# Patient Record
Sex: Female | Born: 1955 | Race: White | Hispanic: No | Marital: Married | Smoking: Former smoker
Health system: Southern US, Community
[De-identification: ages and names within clinical notes are randomized; demographics above are authoritative.]

## PROBLEM LIST (undated history)

## (undated) DIAGNOSIS — M1990 Unspecified osteoarthritis, unspecified site: Secondary | ICD-10-CM

## (undated) DIAGNOSIS — Z820 Family history of epilepsy and other diseases of the nervous system: Secondary | ICD-10-CM

## (undated) DIAGNOSIS — I499 Cardiac arrhythmia, unspecified: Secondary | ICD-10-CM

## (undated) DIAGNOSIS — F32A Depression, unspecified: Secondary | ICD-10-CM

## (undated) DIAGNOSIS — R51 Headache: Secondary | ICD-10-CM

## (undated) DIAGNOSIS — K219 Gastro-esophageal reflux disease without esophagitis: Secondary | ICD-10-CM

## (undated) DIAGNOSIS — K449 Diaphragmatic hernia without obstruction or gangrene: Secondary | ICD-10-CM

## (undated) DIAGNOSIS — K8020 Calculus of gallbladder without cholecystitis without obstruction: Secondary | ICD-10-CM

## (undated) DIAGNOSIS — G5762 Lesion of plantar nerve, left lower limb: Secondary | ICD-10-CM

## (undated) DIAGNOSIS — R519 Headache, unspecified: Secondary | ICD-10-CM

## (undated) DIAGNOSIS — R112 Nausea with vomiting, unspecified: Secondary | ICD-10-CM

## (undated) DIAGNOSIS — F329 Major depressive disorder, single episode, unspecified: Secondary | ICD-10-CM

## (undated) DIAGNOSIS — Z9889 Other specified postprocedural states: Secondary | ICD-10-CM

## (undated) DIAGNOSIS — M779 Enthesopathy, unspecified: Secondary | ICD-10-CM

## (undated) DIAGNOSIS — N200 Calculus of kidney: Secondary | ICD-10-CM

## (undated) HISTORY — DX: Family history of epilepsy and other diseases of the nervous system: Z82.0

## (undated) HISTORY — DX: Calculus of kidney: N20.0

## (undated) HISTORY — PX: COLONOSCOPY: SHX174

## (undated) HISTORY — PX: NASAL SINUS SURGERY: SHX719

## (undated) HISTORY — PX: TUBAL LIGATION: SHX77

## (undated) HISTORY — DX: Diaphragmatic hernia without obstruction or gangrene: K44.9

## (undated) HISTORY — DX: Gastro-esophageal reflux disease without esophagitis: K21.9

## (undated) HISTORY — DX: Calculus of gallbladder without cholecystitis without obstruction: K80.20

## (undated) HISTORY — PX: MOUTH SURGERY: SHX715

---

## 1997-12-05 ENCOUNTER — Other Ambulatory Visit: Admission: RE | Admit: 1997-12-05 | Discharge: 1997-12-05 | Payer: Self-pay | Admitting: Obstetrics & Gynecology

## 1998-03-01 ENCOUNTER — Other Ambulatory Visit: Admission: RE | Admit: 1998-03-01 | Discharge: 1998-03-01 | Payer: Self-pay | Admitting: Obstetrics & Gynecology

## 1998-04-17 ENCOUNTER — Ambulatory Visit (HOSPITAL_COMMUNITY): Admission: RE | Admit: 1998-04-17 | Discharge: 1998-04-17 | Payer: Self-pay | Admitting: General Surgery

## 1998-04-17 ENCOUNTER — Encounter: Payer: Self-pay | Admitting: General Surgery

## 1998-04-19 ENCOUNTER — Other Ambulatory Visit: Admission: RE | Admit: 1998-04-19 | Discharge: 1998-04-19 | Payer: Self-pay | Admitting: Obstetrics & Gynecology

## 1998-08-07 ENCOUNTER — Other Ambulatory Visit: Admission: RE | Admit: 1998-08-07 | Discharge: 1998-08-07 | Payer: Self-pay | Admitting: Obstetrics & Gynecology

## 1999-03-06 ENCOUNTER — Other Ambulatory Visit: Admission: RE | Admit: 1999-03-06 | Discharge: 1999-03-06 | Payer: Self-pay | Admitting: Obstetrics & Gynecology

## 1999-06-14 ENCOUNTER — Other Ambulatory Visit: Admission: RE | Admit: 1999-06-14 | Discharge: 1999-06-14 | Payer: Self-pay | Admitting: Obstetrics & Gynecology

## 1999-06-14 ENCOUNTER — Encounter (INDEPENDENT_AMBULATORY_CARE_PROVIDER_SITE_OTHER): Payer: Self-pay | Admitting: Specialist

## 1999-12-18 ENCOUNTER — Other Ambulatory Visit: Admission: RE | Admit: 1999-12-18 | Discharge: 1999-12-18 | Payer: Self-pay | Admitting: Obstetrics & Gynecology

## 2000-10-22 ENCOUNTER — Other Ambulatory Visit: Admission: RE | Admit: 2000-10-22 | Discharge: 2000-10-22 | Payer: Self-pay | Admitting: Obstetrics & Gynecology

## 2001-07-08 ENCOUNTER — Other Ambulatory Visit: Admission: RE | Admit: 2001-07-08 | Discharge: 2001-07-08 | Payer: Self-pay | Admitting: Obstetrics & Gynecology

## 2002-03-08 ENCOUNTER — Encounter: Payer: Self-pay | Admitting: Otolaryngology

## 2002-03-08 ENCOUNTER — Encounter: Admission: RE | Admit: 2002-03-08 | Discharge: 2002-03-08 | Payer: Self-pay | Admitting: Otolaryngology

## 2002-08-04 ENCOUNTER — Other Ambulatory Visit: Admission: RE | Admit: 2002-08-04 | Discharge: 2002-08-04 | Payer: Self-pay | Admitting: Obstetrics & Gynecology

## 2003-09-01 ENCOUNTER — Other Ambulatory Visit: Admission: RE | Admit: 2003-09-01 | Discharge: 2003-09-01 | Payer: Self-pay | Admitting: Obstetrics & Gynecology

## 2003-11-02 ENCOUNTER — Encounter: Admission: RE | Admit: 2003-11-02 | Discharge: 2003-11-02 | Payer: Self-pay | Admitting: Obstetrics & Gynecology

## 2004-10-18 ENCOUNTER — Other Ambulatory Visit: Admission: RE | Admit: 2004-10-18 | Discharge: 2004-10-18 | Payer: Self-pay | Admitting: Obstetrics & Gynecology

## 2009-04-24 ENCOUNTER — Emergency Department (HOSPITAL_COMMUNITY): Admission: EM | Admit: 2009-04-24 | Discharge: 2009-04-25 | Payer: Self-pay | Admitting: Emergency Medicine

## 2010-04-11 LAB — BASIC METABOLIC PANEL
CO2: 27 mEq/L (ref 19–32)
Calcium: 9.7 mg/dL (ref 8.4–10.5)
Creatinine, Ser: 1.02 mg/dL (ref 0.4–1.2)
GFR calc Af Amer: >60 mL/min (ref >60)
GFR calc non Af Amer: 56 mL/min — ABNORMAL LOW (ref >60)
Glucose, Bld: 136 mg/dL — ABNORMAL HIGH (ref 70–99)
Sodium: 140 mEq/L (ref 135–145)

## 2010-04-11 LAB — CBC
HCT: 39.1 % (ref 36.0–46.0)
MCHC: 33.7 g/dL (ref 30.0–36.0)
MCV: 88.5 fL (ref 78.0–100.0)
Platelets: 330 10*3/uL (ref 150–400)

## 2010-04-11 LAB — DIFFERENTIAL
Basophils Relative: 0 % (ref 0–1)
Eosinophils Absolute: 0.1 10*3/uL (ref 0.0–0.7)
Eosinophils Relative: 1 % (ref 0–5)
Lymphs Abs: 1.4 10*3/uL (ref 0.7–4.0)
Neutrophils Relative %: 86 % — ABNORMAL HIGH (ref 43–77)

## 2010-04-11 LAB — URINALYSIS, ROUTINE W REFLEX MICROSCOPIC
Bilirubin Urine: NEGATIVE
Glucose, UA: NEGATIVE mg/dL
Hgb urine dipstick: NEGATIVE
Ketones, ur: NEGATIVE mg/dL
Nitrite: NEGATIVE
Protein, ur: NEGATIVE mg/dL
Specific Gravity, Urine: 1.015 (ref 1.005–1.030)
Urobilinogen, UA: 0.2 mg/dL (ref 0.0–1.0)
pH: 7.5 (ref 5.0–8.0)

## 2010-04-11 LAB — URINE MICROSCOPIC-ADD ON

## 2010-10-29 ENCOUNTER — Ambulatory Visit
Admission: RE | Admit: 2010-10-29 | Discharge: 2010-10-29 | Disposition: A | Discharge status: Home / Self Care | Payer: BC Managed Care – PPO | Source: Ambulatory Visit | Attending: Internal Medicine | Admitting: Internal Medicine

## 2010-10-29 ENCOUNTER — Other Ambulatory Visit: Payer: Self-pay | Admitting: Internal Medicine

## 2010-10-29 MED ORDER — IOHEXOL 300 MG/ML  SOLN
100.0000 mL | Freq: Once | INTRAMUSCULAR | Status: AC | PRN
  Administered 2010-10-29: 100 mL via INTRAVENOUS

## 2010-11-15 ENCOUNTER — Ambulatory Visit (HOSPITAL_COMMUNITY)
Admission: RE | Admit: 2010-11-15 | Discharge: 2010-11-15 | Disposition: A | Discharge status: Home / Self Care | Payer: BC Managed Care – PPO | Source: Ambulatory Visit | Attending: Urology | Admitting: Urology

## 2010-11-15 DIAGNOSIS — Z79899 Other long term (current) drug therapy: Secondary | ICD-10-CM | POA: Insufficient documentation

## 2010-11-15 DIAGNOSIS — E7800 Pure hypercholesterolemia, unspecified: Secondary | ICD-10-CM | POA: Insufficient documentation

## 2010-11-15 DIAGNOSIS — N200 Calculus of kidney: Secondary | ICD-10-CM | POA: Insufficient documentation

## 2010-11-15 DIAGNOSIS — N201 Calculus of ureter: Secondary | ICD-10-CM | POA: Insufficient documentation

## 2011-01-22 HISTORY — PX: FOOT SURGERY: SHX648

## 2011-01-22 HISTORY — PX: LITHOTRIPSY: SUR834

## 2011-03-13 ENCOUNTER — Other Ambulatory Visit: Payer: Self-pay | Admitting: Obstetrics & Gynecology

## 2011-03-13 DIAGNOSIS — R928 Other abnormal and inconclusive findings on diagnostic imaging of breast: Secondary | ICD-10-CM

## 2011-03-21 ENCOUNTER — Ambulatory Visit
Admission: RE | Admit: 2011-03-21 | Discharge: 2011-03-21 | Disposition: A | Discharge status: Home / Self Care | Payer: BC Managed Care – PPO | Source: Ambulatory Visit | Attending: Obstetrics & Gynecology | Admitting: Obstetrics & Gynecology

## 2011-03-21 DIAGNOSIS — R928 Other abnormal and inconclusive findings on diagnostic imaging of breast: Secondary | ICD-10-CM

## 2012-04-16 ENCOUNTER — Ambulatory Visit (INDEPENDENT_AMBULATORY_CARE_PROVIDER_SITE_OTHER): Payer: BC Managed Care – PPO | Admitting: General Surgery

## 2012-04-16 ENCOUNTER — Encounter (INDEPENDENT_AMBULATORY_CARE_PROVIDER_SITE_OTHER): Payer: Self-pay | Admitting: General Surgery

## 2012-04-16 ENCOUNTER — Encounter (INDEPENDENT_AMBULATORY_CARE_PROVIDER_SITE_OTHER): Payer: Self-pay

## 2012-04-16 VITALS — BP 120/80 | HR 92 | Resp 14 | Ht 67.0 in | Wt 178.0 lb

## 2012-04-16 DIAGNOSIS — K8020 Calculus of gallbladder without cholecystitis without obstruction: Secondary | ICD-10-CM

## 2012-04-16 NOTE — Progress Notes (Signed)
Patient ID: Carrie Holland, female   DOB: September 12, 1955, 57 y.o.   MRN: [id number]  No chief complaint on file.   HPI Carrie Holland is a 58 y.o. female.   HPI  She is referred by Dr. Ishmael Holter because of symptomatic gallstones. She's been having problems with these for about 4 years. Recently her episodes have become more frequent. They consist of right upper quadrant pain with some nausea and vomiting. She's had known gallstones for 3-4 years. No jaundice. There is a significant family history of gallbladder disease.  Past Medical History  Diagnosis Date  . Hiatal hernia   . FHx: migraine headaches   . Gallstones   . Kidney stones   . GERD (gastroesophageal reflux disease)     Past Surgical History  Procedure Laterality Date  . Tubal ligation    . Cesarean section  1982 & 1985  . Nasal sinus surgery    . Foot surgery  2013  . Lithotripsy  2013    Family History  Problem Relation Age of Onset  . Heart disease Father   . Headache Father   . Lung disease Father   . Hepatitis Brother     Social History History  Substance Use Topics  . Smoking status: Former Games developer  . Smokeless tobacco: Not on file  . Alcohol Use: Yes     Comment: socially    Allergies  Allergen Reactions  . Ceclor (Cefaclor)   . Penicillins     Current Outpatient Prescriptions  Medication Sig Dispense Refill  . atorvastatin (LIPITOR) 40 MG tablet Take 40 mg by mouth daily.      Marland Kitchen ALPRAZolam (XANAX) 0.5 MG tablet Take 0.5 mg by mouth at bedtime as needed for sleep.      . Venlafaxine HCl (EFFEXOR PO) Take 220 mg by mouth daily.        No current facility-administered medications for this visit.    Review of Systems Review of Systems  Respiratory: Negative.   Gastrointestinal: Positive for nausea, vomiting and abdominal pain.  Genitourinary: Negative.   Allergic/Immunologic: Negative for immunocompromised state.  Hematological: Negative.     Blood pressure 120/80, pulse 92, resp.  rate 14, height 5\' 7"  (1.702 m), weight 178 lb (80.74 kg).  Physical Exam Physical Exam  Constitutional: She appears well-developed and well-nourished. No distress.  HENT:  Head: Normocephalic and atraumatic.  Eyes: EOM are normal. No scleral icterus.  Cardiovascular: Normal rate and regular rhythm.   Pulmonary/Chest: Effort normal and breath sounds normal.  Abdominal: Soft. She exhibits no distension and no mass. There is no tenderness.  Small subumbilical scar. Lower transverse scar.  Musculoskeletal: She exhibits no edema.  Neurological: She is alert.  Skin: Skin is warm and dry.    Data Reviewed Old CT scan demonstrating cholelithiasis.  Assessment    Symptomatic cholelithiasis.     Plan    I recommended laparoscopic cholecystectomy.  I have explained the procedure, risks, and aftercare of cholecystectomy.  Risks include but are not limited to bleeding, infection, wound problems, anesthesia, diarrhea, bile leak, injury to common bile duct/liver/intestine.  She seems to understand and agrees to proceed.         Mahir Prabhakar J 04/16/2012, 2:06 PM

## 2012-04-16 NOTE — Patient Instructions (Signed)
[facility] ______[facility] LAPAROSCOPIC SURGERY: POST OP INSTRUCTIONS Always review your discharge instruction sheet given to you by the facility where your surgery was performed. IF YOU HAVE DISABILITY OR FAMILY LEAVE FORMS, YOU MUST BRING THEM TO THE OFFICE FOR PROCESSING.   DO NOT GIVE THEM TO YOUR DOCTOR.  1. A prescription for pain medication may be given to you upon discharge.  Take your pain medication as prescribed, if needed.  If narcotic pain medicine is not needed, then you may take acetaminophen (Tylenol) or ibuprofen (Advil) as needed. 2. Take your usually prescribed medications unless otherwise directed. 3. If you need a refill on your pain medication, please contact your pharmacy.  They will contact our office to request authorization. Prescriptions will not be filled after 5pm or on week-ends. 4. You should follow a light diet the first few days after arrival home, such as soup and crackers, etc.  Be sure to include lots of fluids daily. 5. Most patients will experience some swelling and bruising in the area of the incisions.  Ice packs will help.  Swelling and bruising can take several days to resolve.  6. It is common to experience some constipation if taking pain medication after surgery.  Increasing fluid intake and taking a stool softener (such as Colace) will usually help or prevent this problem from occurring.  A mild laxative (Milk of Magnesia or Miralax) should be taken according to package instructions if there are no bowel movements after 48 hours. 7. Unless discharge instructions indicate otherwise, you may remove your bandages 24-48 hours after surgery, and you may shower at that time.  You may have steri-strips (small skin tapes) in place directly over the incision.  These strips should be left on the skin for 7-10 days.  If your surgeon used skin glue on the incision, you may shower in 24 hours.  The glue will flake off over the next 2-3 weeks.  Any sutures or  staples will be removed at the office during your follow-up visit. 8. ACTIVITIES:  You may resume regular (light) daily activities beginning the next day-such as daily self-care, walking, climbing stairs-gradually increasing activities as tolerated.  You may have sexual intercourse when it is comfortable.  Refrain from any heavy lifting or straining for two weeks. a. You may drive when you are no longer taking prescription pain medication, you can comfortably wear a seatbelt, and you can safely maneuver your car and apply brakes. b. RETURN TO WORK:  __________________________________________________________ 9. You should see your doctor in the office for a follow-up appointment approximately 2-3 weeks after your surgery.  Make sure that you call for this appointment within a day or two after you arrive home to insure a convenient appointment time. 10. OTHER INSTRUCTIONS: __________________________________________________________________________________________________________________________ __________________________________________________________________________________________________________________________ WHEN TO CALL YOUR DOCTOR: 1. Fever over 101.5 2. Inability to urinate 3. Continued bleeding from incision. 4. Increased pain, redness, or drainage from the incision. 5. Increasing abdominal pain  The clinic staff is available to answer your questions during regular business hours.  Please don't hesitate to call and ask to speak to one of the nurses for clinical concerns.  If you have a medical emergency, go to the nearest emergency room or call 911.  A surgeon from [facility] is always on call at the hospital. [address], [place], [place]  [address] ? [address] [address], [place], [place]   [address] [phone] ? [phone] ? FAX [phone] Web site: [email or web address]

## 2012-04-17 ENCOUNTER — Encounter (HOSPITAL_COMMUNITY): Payer: Self-pay | Admitting: Pharmacy Technician

## 2012-04-20 NOTE — Patient Instructions (Signed)
Carrie Holland  04/20/2012   Your procedure is scheduled on:  04/24/12   Report to [facility] at   0900  AM.  Call this number if you have problems the morning of surgery: [phone]   Remember:   Do not eat food or drink liquids after midnight.   Take these medicines the morning of surgery with A SIP OF WATER:    Do not wear jewelry, make-up or nail polish.  Do not wear lotions, powders, or perfumes.   Do not shave 48 hours prior to surgery.   Do not bring valuables to the hospital.  Contacts, dentures or bridgework may not be worn into surgery.      Patients discharged the day of surgery will not be allowed to drive  home.  Name and phone number of your driver:    SEE [facility]G INSTRUCTION SHEET    Please read over the following fact sheets that you were given: MRSA Information, coughing and deep breathing exercises, leg exercises               Failure to comply with these instructions may result in cancellation of your surgery.                Patient Signature ____________________________              Nurse Signature _____________________________

## 2012-04-21 ENCOUNTER — Encounter (HOSPITAL_COMMUNITY): Payer: Self-pay

## 2012-04-21 ENCOUNTER — Encounter (HOSPITAL_COMMUNITY)
Admission: RE | Admit: 2012-04-21 | Discharge: 2012-04-21 | Disposition: A | Discharge status: Home / Self Care | Payer: BC Managed Care – PPO | Source: Ambulatory Visit | Attending: General Surgery | Admitting: General Surgery

## 2012-04-21 HISTORY — DX: Unspecified osteoarthritis, unspecified site: M19.90

## 2012-04-21 HISTORY — DX: Depression, unspecified: F32.A

## 2012-04-21 HISTORY — DX: Enthesopathy, unspecified: M77.9

## 2012-04-21 HISTORY — DX: Major depressive disorder, single episode, unspecified: F32.9

## 2012-04-21 HISTORY — DX: Other specified postprocedural states: Z98.890

## 2012-04-21 HISTORY — DX: Cardiac arrhythmia, unspecified: I49.9

## 2012-04-21 HISTORY — DX: Other specified postprocedural states: R11.2

## 2012-04-21 LAB — COMPREHENSIVE METABOLIC PANEL
ALT: 29 U/L (ref 0–35)
AST: 29 U/L (ref 0–37)
Albumin: 4.3 g/dL (ref 3.5–5.2)
Alkaline Phosphatase: 91 U/L (ref 39–117)
Chloride: 102 mEq/L (ref 96–112)
Potassium: 4.1 mEq/L (ref 3.5–5.1)
Sodium: 139 mEq/L (ref 135–145)
Total Bilirubin: 0.5 mg/dL (ref 0.3–1.2)
Total Protein: 7.5 g/dL (ref 6.0–8.3)

## 2012-04-21 LAB — CBC WITH DIFFERENTIAL/PLATELET
Basophils Relative: 0 % (ref 0–1)
Eosinophils Absolute: 0.1 10*3/uL (ref 0.0–0.7)
Hemoglobin: 14.8 g/dL (ref 12.0–15.0)
MCH: 29.1 pg (ref 26.0–34.0)
MCHC: 33.3 g/dL (ref 30.0–36.0)
Neutro Abs: 5.4 10*3/uL (ref 1.7–7.7)
Neutrophils Relative %: 60 % (ref 43–77)
Platelets: 336 10*3/uL (ref 150–400)
RBC: 5.08 MIL/uL (ref 3.87–5.11)

## 2012-04-21 LAB — PROTIME-INR: Prothrombin Time: 13 seconds (ref 11.6–15.2)

## 2012-04-24 ENCOUNTER — Encounter (HOSPITAL_COMMUNITY)
Admission: RE | Disposition: A | Discharge status: Home / Self Care | Payer: Self-pay | Source: Ambulatory Visit | Attending: General Surgery

## 2012-04-24 ENCOUNTER — Encounter (HOSPITAL_COMMUNITY): Payer: Self-pay | Admitting: *Deleted

## 2012-04-24 ENCOUNTER — Ambulatory Visit (HOSPITAL_COMMUNITY)
Admission: RE | Admit: 2012-04-24 | Discharge: 2012-04-24 | Disposition: A | Discharge status: Home / Self Care | Payer: BC Managed Care – PPO | Source: Ambulatory Visit | Attending: General Surgery | Admitting: General Surgery

## 2012-04-24 ENCOUNTER — Ambulatory Visit (HOSPITAL_COMMUNITY): Payer: BC Managed Care – PPO

## 2012-04-24 ENCOUNTER — Encounter (HOSPITAL_COMMUNITY): Payer: Self-pay | Admitting: Anesthesiology

## 2012-04-24 ENCOUNTER — Ambulatory Visit (HOSPITAL_COMMUNITY): Payer: BC Managed Care – PPO | Admitting: Anesthesiology

## 2012-04-24 DIAGNOSIS — K8010 Calculus of gallbladder with chronic cholecystitis without obstruction: Secondary | ICD-10-CM

## 2012-04-24 DIAGNOSIS — K449 Diaphragmatic hernia without obstruction or gangrene: Secondary | ICD-10-CM | POA: Insufficient documentation

## 2012-04-24 DIAGNOSIS — K824 Cholesterolosis of gallbladder: Secondary | ICD-10-CM

## 2012-04-24 DIAGNOSIS — K219 Gastro-esophageal reflux disease without esophagitis: Secondary | ICD-10-CM | POA: Insufficient documentation

## 2012-04-24 DIAGNOSIS — Z01810 Encounter for preprocedural cardiovascular examination: Secondary | ICD-10-CM | POA: Insufficient documentation

## 2012-04-24 DIAGNOSIS — K8020 Calculus of gallbladder without cholecystitis without obstruction: Secondary | ICD-10-CM | POA: Insufficient documentation

## 2012-04-24 DIAGNOSIS — Z79899 Other long term (current) drug therapy: Secondary | ICD-10-CM | POA: Insufficient documentation

## 2012-04-24 HISTORY — PX: CHOLECYSTECTOMY: SHX55

## 2012-04-24 SURGERY — LAPAROSCOPIC CHOLECYSTECTOMY WITH INTRAOPERATIVE CHOLANGIOGRAM
Anesthesia: General | Site: Abdomen | Wound class: Clean Contaminated

## 2012-04-24 MED ORDER — FENTANYL CITRATE 0.05 MG/ML IJ SOLN
INTRAMUSCULAR | Status: DC | PRN
  Administered 2012-04-24: 50 ug via INTRAVENOUS
  Administered 2012-04-24: 100 ug via INTRAVENOUS
  Administered 2012-04-24: 50 ug via INTRAVENOUS

## 2012-04-24 MED ORDER — MIDAZOLAM HCL 5 MG/5ML IJ SOLN
INTRAMUSCULAR | Status: DC | PRN
  Administered 2012-04-24: 2 mg via INTRAVENOUS

## 2012-04-24 MED ORDER — FENTANYL CITRATE 0.05 MG/ML IJ SOLN: 25.0000 ug | INTRAMUSCULAR | Status: DC | PRN

## 2012-04-24 MED ORDER — MEPERIDINE HCL 50 MG/ML IJ SOLN
INTRAMUSCULAR | Status: AC
  Filled 2012-04-24: qty 1

## 2012-04-24 MED ORDER — ACETAMINOPHEN 325 MG PO TABS: 650.0000 mg | ORAL_TABLET | ORAL | Status: DC | PRN

## 2012-04-24 MED ORDER — NEOSTIGMINE METHYLSULFATE 1 MG/ML IJ SOLN
INTRAMUSCULAR | Status: DC | PRN
  Administered 2012-04-24: 4 mg via INTRAVENOUS

## 2012-04-24 MED ORDER — ACETAMINOPHEN 10 MG/ML IV SOLN
INTRAVENOUS | Status: DC | PRN
  Administered 2012-04-24: 1000 mg via INTRAVENOUS

## 2012-04-24 MED ORDER — LIDOCAINE HCL (CARDIAC) 20 MG/ML IV SOLN
INTRAVENOUS | Status: DC | PRN
  Administered 2012-04-24: 50 mg via INTRAVENOUS

## 2012-04-24 MED ORDER — LACTATED RINGERS IV SOLN
INTRAVENOUS | Status: DC | PRN
  Administered 2012-04-24: 1000 mL via INTRAVENOUS

## 2012-04-24 MED ORDER — ROCURONIUM BROMIDE 100 MG/10ML IV SOLN
INTRAVENOUS | Status: DC | PRN
  Administered 2012-04-24: 40 mg via INTRAVENOUS

## 2012-04-24 MED ORDER — IOHEXOL 300 MG/ML  SOLN
INTRAMUSCULAR | Status: AC
  Filled 2012-04-24: qty 1

## 2012-04-24 MED ORDER — PROPOFOL 10 MG/ML IV BOLUS
INTRAVENOUS | Status: DC | PRN
  Administered 2012-04-24: 180 mg via INTRAVENOUS

## 2012-04-24 MED ORDER — METOCLOPRAMIDE HCL 5 MG/ML IJ SOLN
INTRAMUSCULAR | Status: DC | PRN
  Administered 2012-04-24: 10 mg via INTRAVENOUS

## 2012-04-24 MED ORDER — ONDANSETRON HCL 4 MG/2ML IJ SOLN
INTRAMUSCULAR | Status: DC | PRN
  Administered 2012-04-24: 4 mg via INTRAVENOUS

## 2012-04-24 MED ORDER — PHENYLEPHRINE HCL 10 MG/ML IJ SOLN
INTRAMUSCULAR | Status: DC | PRN
  Administered 2012-04-24: 80 ug via INTRAVENOUS

## 2012-04-24 MED ORDER — MEPERIDINE HCL 50 MG/ML IJ SOLN
6.2500 mg | INTRAMUSCULAR | Status: DC | PRN
  Administered 2012-04-24: 12.5 mg via INTRAVENOUS

## 2012-04-24 MED ORDER — PROMETHAZINE HCL 25 MG/ML IJ SOLN: 6.2500 mg | INTRAMUSCULAR | Status: DC | PRN

## 2012-04-24 MED ORDER — DEXTROSE IN LACTATED RINGERS 5 % IV SOLN
INTRAVENOUS | Status: DC
  Administered 2012-04-24: 14:00:00 via INTRAVENOUS

## 2012-04-24 MED ORDER — ACETAMINOPHEN 10 MG/ML IV SOLN
INTRAVENOUS | Status: AC
  Filled 2012-04-24: qty 100

## 2012-04-24 MED ORDER — CIPROFLOXACIN IN D5W 400 MG/200ML IV SOLN
400.0000 mg | INTRAVENOUS | Status: AC
  Administered 2012-04-24: 400 mg via INTRAVENOUS

## 2012-04-24 MED ORDER — CIPROFLOXACIN IN D5W 400 MG/200ML IV SOLN
INTRAVENOUS | Status: AC
  Filled 2012-04-24: qty 200

## 2012-04-24 MED ORDER — SODIUM CHLORIDE 0.9 % IJ SOLN: 3.0000 mL | Freq: Two times a day (BID) | INTRAMUSCULAR | Status: DC

## 2012-04-24 MED ORDER — KETOROLAC TROMETHAMINE 30 MG/ML IJ SOLN: 15.0000 mg | Freq: Once | INTRAMUSCULAR | Status: DC | PRN

## 2012-04-24 MED ORDER — LACTATED RINGERS IV SOLN
INTRAVENOUS | Status: DC
  Administered 2012-04-24: 12:00:00 via INTRAVENOUS
  Administered 2012-04-24: 1000 mL via INTRAVENOUS

## 2012-04-24 MED ORDER — BUPIVACAINE HCL (PF) 0.5 % IJ SOLN
INTRAMUSCULAR | Status: AC
  Filled 2012-04-24: qty 30

## 2012-04-24 MED ORDER — SODIUM CHLORIDE 0.9 % IV SOLN: 250.0000 mL | INTRAVENOUS | Status: DC | PRN

## 2012-04-24 MED ORDER — SODIUM CHLORIDE 0.9 % IJ SOLN: 3.0000 mL | INTRAMUSCULAR | Status: DC | PRN

## 2012-04-24 MED ORDER — BUPIVACAINE HCL 0.5 % IJ SOLN
INTRAMUSCULAR | Status: DC | PRN
  Administered 2012-04-24: 15 mL

## 2012-04-24 MED ORDER — ONDANSETRON HCL 4 MG/2ML IJ SOLN: 4.0000 mg | Freq: Four times a day (QID) | INTRAMUSCULAR | Status: DC | PRN

## 2012-04-24 MED ORDER — ACETAMINOPHEN 650 MG RE SUPP
650.0000 mg | RECTAL | Status: DC | PRN
  Filled 2012-04-24: qty 1

## 2012-04-24 MED ORDER — SCOPOLAMINE 1 MG/3DAYS TD PT72
MEDICATED_PATCH | TRANSDERMAL | Status: AC
  Filled 2012-04-24: qty 1

## 2012-04-24 MED ORDER — MORPHINE SULFATE 10 MG/ML IJ SOLN: 2.0000 mg | INTRAMUSCULAR | Status: DC | PRN

## 2012-04-24 MED ORDER — IOHEXOL 300 MG/ML  SOLN
INTRAMUSCULAR | Status: DC | PRN
  Administered 2012-04-24: 50 mL via INTRAVENOUS

## 2012-04-24 MED ORDER — GLYCOPYRROLATE 0.2 MG/ML IJ SOLN
INTRAMUSCULAR | Status: DC | PRN
  Administered 2012-04-24: 0.6 mg via INTRAVENOUS

## 2012-04-24 MED ORDER — MEPERIDINE HCL 50 MG PO TABS: 50.0000 mg | ORAL_TABLET | ORAL | Status: DC | PRN

## 2012-04-24 MED ORDER — DEXAMETHASONE SODIUM PHOSPHATE 10 MG/ML IJ SOLN
INTRAMUSCULAR | Status: DC | PRN
  Administered 2012-04-24: 10 mg via INTRAVENOUS

## 2012-04-24 SURGICAL SUPPLY — 41 items
APPLIER CLIP 5 13 M/L LIGAMAX5 (MISCELLANEOUS) ×2
APPLIER CLIP ROT 10 11.4 M/L (STAPLE)
BENZOIN TINCTURE PRP APPL 2/3 (GAUZE/BANDAGES/DRESSINGS) ×2 IMPLANT
CANISTER SUCTION 2500CC (MISCELLANEOUS) ×2 IMPLANT
CHLORAPREP W/TINT 26ML (MISCELLANEOUS) ×2 IMPLANT
CLIP APPLIE 5 13 M/L LIGAMAX5 (MISCELLANEOUS) ×1 IMPLANT
CLIP APPLIE ROT 10 11.4 M/L (STAPLE) IMPLANT
CLOTH BEACON ORANGE TIMEOUT ST (SAFETY) ×2 IMPLANT
COVER MAYO STAND STRL (DRAPES) ×2 IMPLANT
COVER SURGICAL LIGHT HANDLE (MISCELLANEOUS) IMPLANT
DECANTER SPIKE VIAL GLASS SM (MISCELLANEOUS) ×2 IMPLANT
DRAPE C-ARM 42X72 X-RAY (DRAPES) ×2 IMPLANT
DRAPE LAPAROSCOPIC ABDOMINAL (DRAPES) ×2 IMPLANT
DRAPE UTILITY XL STRL (DRAPES) ×2 IMPLANT
DRSG TEGADERM 2-3/8X2-3/4 SM (GAUZE/BANDAGES/DRESSINGS) ×8 IMPLANT
ELECT REM PT RETURN 9FT ADLT (ELECTROSURGICAL) ×2
ELECTRODE REM PT RTRN 9FT ADLT (ELECTROSURGICAL) ×1 IMPLANT
ENDOLOOP SUT PDS II  0 18 (SUTURE)
ENDOLOOP SUT PDS II 0 18 (SUTURE) IMPLANT
GLOVE BIOGEL PI IND STRL 7.0 (GLOVE) ×1 IMPLANT
GLOVE BIOGEL PI INDICATOR 7.0 (GLOVE) ×1
GLOVE ECLIPSE 8.0 STRL XLNG CF (GLOVE) ×2 IMPLANT
GLOVE INDICATOR 8.0 STRL GRN (GLOVE) ×4 IMPLANT
GOWN STRL NON-REIN LRG LVL3 (GOWN DISPOSABLE) ×2 IMPLANT
GOWN STRL REIN XL XLG (GOWN DISPOSABLE) ×8 IMPLANT
HEMOSTAT SURGICEL 4X8 (HEMOSTASIS) IMPLANT
IV CATH 14GX2 1/4 (CATHETERS) IMPLANT
KIT BASIN OR (CUSTOM PROCEDURE TRAY) ×2 IMPLANT
NS IRRIG 1000ML POUR BTL (IV SOLUTION) ×2 IMPLANT
POUCH SPECIMEN RETRIEVAL 10MM (ENDOMECHANICALS) ×2 IMPLANT
SET CHOLANGIOGRAPH MIX (MISCELLANEOUS) ×2 IMPLANT
SET IRRIG TUBING LAPAROSCOPIC (IRRIGATION / IRRIGATOR) ×2 IMPLANT
SOLUTION ANTI FOG 6CC (MISCELLANEOUS) ×2 IMPLANT
STRIP CLOSURE SKIN 1/2X4 (GAUZE/BANDAGES/DRESSINGS) ×2 IMPLANT
SUT MNCRL AB 4-0 PS2 18 (SUTURE) ×2 IMPLANT
TOWEL OR 17X26 10 PK STRL BLUE (TOWEL DISPOSABLE) ×2 IMPLANT
TRAY LAP CHOLE (CUSTOM PROCEDURE TRAY) ×2 IMPLANT
TROCAR BLADELESS OPT 5 75 (ENDOMECHANICALS) ×6 IMPLANT
TROCAR XCEL BLUNT TIP 100MML (ENDOMECHANICALS) ×2 IMPLANT
TROCAR XCEL NON-BLD 11X100MML (ENDOMECHANICALS) ×2 IMPLANT
TUBING INSUFFLATION 10FT LAP (TUBING) ×2 IMPLANT

## 2012-04-24 NOTE — Progress Notes (Signed)
Dr. Okey Dupre made aware of patient's heart rates being [phone] blood pressures stable.

## 2012-04-24 NOTE — Op Note (Signed)
Preoperative diagnosis:  Symptomatic cholelithiasis  Postoperative diagnosis:  same  Procedure: Laparoscopic cholecystectomy with cholangiogram.  Surgeon: Avel Peace, M.D.  Asst.:  Romie Levee, M.D.  Anesthesia: Gen.  Indication:   This is a 57 year old female with a long history of gallstones. Recently she began having classic cases of biliary colic. She now presents for elective laparoscopic cholecystectomy. The procedure, risks, and after care were discussed with her preoperatively.  Technique: She was brought to the operating room, placed supine on the operating table, and a general anesthetic was administered. The hair on the abdominal wall was clipped as was necessary. The abdominal wall was then sterilely prepped and draped. Local anesthetic (Marcaine) was infiltrated in the subumbilical region. A small subumbilical incision was made through the skin, subcutaneous tissue, fascia, and peritoneum entering the peritoneal cavity under direct vision. A pursestring suture of 0 Vicryl was placed around the edges of the fascia. A Hassan trocar was introduced into the peritoneal cavity and a pneumoperitoneum was created by insufflation of carbon dioxide gas. The laparoscope was introduced into the trocar and no underlying bleeding or organ injury was noted. The patient was then placed in the reverse Trendelenburg position with the right side tilted slightly up.  Three more trochars were then placed into the abdominal cavity under laparoscopic vision. One in the epigastric area, and 2 in the right upper quadrant area. The gallbladder was visualized and the fundus was grasped and retracted toward the right shoulder.  Thin adhesions between the duodenum and gallbladder were divided sharply.  The infundibulum was mobilized with dissection close to the gallbladder and retracted laterally. The cystic duct was identified and a window was created around it. The cystic artery was also identified and a  window was created around it. The critical view was achieved. A clip was placed at the neck of the gallbladder. A small incision was made in the cystic duct. A cholangiocatheter was introduced through the anterior abdominal wall and placed in the cystic duct. A intraoperative cholangiogram was then performed.  Under real-time fluoroscopy, dilute contrast was injected into the cystic duct.  The common hepatic duct, the right and left hepatic ducts, and the common duct were all visualized. Contrast drained into the duodenum without obvious evidence of any obstructing ductal lesion. The final report is pending the Radiologist's interpretation.  The cholangiocatheter was removed, the cystic duct was clipped 3 times on the biliary side, and then the cystic duct was divided sharply. No bile leak was noted from the cystic duct stump.  The cystic artery was then clipped and divided. Following this the gallbladder was dissected free from the liver using electrocautery. The gallbladder was then placed in a retrieval bag and removed from the abdominal cavity through the subumbilical incision.  The gallbladder fossa was inspected, irrigated, and bleeding was controlled with electrocautery. Inspection showed that hemostasis was adequate and there was no evidence of bile leak.  A piece of Surgicel was placed in the gallbladder fossa.  The irrigation fluid was evacuated as much as possible.  The subumbilical trocar was removed and the fascial defect was closed by tightening and tying down the pursestring suture under laparoscopic vision.  The remaining trochars were removed and the pneumoperitoneum was released. The skin incisions were closed with 4-0 Monocryl subcuticular stitches. Steri-Strips and sterile dressings were applied.  The procedure was well-tolerated without any apparent complications. The patient was taken to the recovery room in satisfactory condition.

## 2012-04-24 NOTE — Anesthesia Postprocedure Evaluation (Signed)
  Anesthesia Post-op Note  Patient: Carrie Holland  Procedure(s) Performed: Procedure(s) (LRB): LAPAROSCOPIC CHOLECYSTECTOMY WITH INTRAOPERATIVE CHOLANGIOGRAM (N/A)  Patient Location: PACU  Anesthesia Type: General  Level of Consciousness: awake and alert   Airway and Oxygen Therapy: Patient Spontanous Breathing  Post-op Pain: mild  Post-op Assessment: Post-op Vital signs reviewed, Patient's Cardiovascular Status Stable, Respiratory Function Stable, Patent Airway and No signs of Nausea or vomiting  Last Vitals:  Filed Vitals:   04/24/12 0859  BP: 124/67  Pulse: 87  Temp: 36.9 C  Resp: 18    Post-op Vital Signs: stable   Complications: No apparent anesthesia complications

## 2012-04-24 NOTE — Interval H&P Note (Signed)
History and Physical Interval Note:  04/24/2012 11:11 AM  Carrie Holland  has presented today for surgery, with the diagnosis of Gallstones  The various methods of treatment have been discussed with the patient and family. After consideration of risks, benefits and other options for treatment, the patient has consented to  Procedure(s): LAPAROSCOPIC CHOLECYSTECTOMY WITH INTRAOPERATIVE CHOLANGIOGRAM (N/A) as a surgical intervention .  The patient's history has been reviewed, patient examined, no change in status, stable for surgery.  I have reviewed the patient's chart and labs.  Questions were answered to the patient's satisfaction.     Willoughby Doell Shela Commons

## 2012-04-24 NOTE — Transfer of Care (Signed)
Immediate Anesthesia Transfer of Care Note  Patient: Carrie Holland  Procedure(s) Performed: Procedure(s): LAPAROSCOPIC CHOLECYSTECTOMY WITH INTRAOPERATIVE CHOLANGIOGRAM (N/A)  Patient Location: PACU  Anesthesia Type:General  Level of Consciousness: awake and alert   Airway & Oxygen Therapy: Patient Spontanous Breathing and Patient connected to face mask oxygen  Post-op Assessment: Report given to PACU RN and Post -op Vital signs reviewed and stable  Post vital signs: Reviewed and stable  Complications: No apparent anesthesia complications

## 2012-04-24 NOTE — Anesthesia Preprocedure Evaluation (Addendum)
Anesthesia Evaluation  Patient identified by MRN, date of birth, ID band Patient awake    Reviewed: Allergy & Precautions, H&P , NPO status , Patient's Chart, lab work & pertinent test results  History of Anesthesia Complications (+) PONV  Airway Mallampati: II TM Distance: >3 FB Neck ROM: Full    Dental no notable dental hx.    Pulmonary neg pulmonary ROS,  breath sounds clear to auscultation  Pulmonary exam normal       Cardiovascular negative cardio ROS  Rhythm:Regular Rate:Normal     Neuro/Psych Anxiety Depression negative neurological ROS     GI/Hepatic negative GI ROS, Neg liver ROS,   Endo/Other  negative endocrine ROS  Renal/GU negative Renal ROS  negative genitourinary   Musculoskeletal negative musculoskeletal ROS (+)   Abdominal   Peds negative pediatric ROS (+)  Hematology negative hematology ROS (+)   Anesthesia Other Findings   Reproductive/Obstetrics negative OB ROS                          Anesthesia Physical Anesthesia Plan  ASA: I  Anesthesia Plan: General   Post-op Pain Management:    Induction: Intravenous  Airway Management Planned: Oral ETT  Additional Equipment:   Intra-op Plan:   Post-operative Plan: Extubation in OR  Informed Consent: I have reviewed the patients History and Physical, chart, labs and discussed the procedure including the risks, benefits and alternatives for the proposed anesthesia with the patient or authorized representative who has indicated his/her understanding and acceptance.   Dental advisory given  Plan Discussed with: CRNA and Surgeon  Anesthesia Plan Comments:         Anesthesia Quick Evaluation

## 2012-04-24 NOTE — H&P (View-Only) (Signed)
Patient ID: Carrie Holland, female   DOB: 09/24/1955, 57 y.o.   MRN: [id number]  No chief complaint on file.   HPI Carrie Holland is a 57 y.o. female.   HPI  She is referred by Dr. Neale because of symptomatic gallstones. She's been having problems with these for about 4 years. Recently her episodes have become more frequent. They consist of right upper quadrant pain with some nausea and vomiting. She's had known gallstones for 3-4 years. No jaundice. There is a significant family history of gallbladder disease.  Past Medical History  Diagnosis Date  . Hiatal hernia   . FHx: migraine headaches   . Gallstones   . Kidney stones   . GERD (gastroesophageal reflux disease)     Past Surgical History  Procedure Laterality Date  . Tubal ligation    . Cesarean section  1982 & 1985  . Nasal sinus surgery    . Foot surgery  2013  . Lithotripsy  2013    Family History  Problem Relation Age of Onset  . Heart disease Father   . Headache Father   . Lung disease Father   . Hepatitis Brother     Social History History  Substance Use Topics  . Smoking status: Former Smoker  . Smokeless tobacco: Not on file  . Alcohol Use: Yes     Comment: socially    Allergies  Allergen Reactions  . Ceclor (Cefaclor)   . Penicillins     Current Outpatient Prescriptions  Medication Sig Dispense Refill  . atorvastatin (LIPITOR) 40 MG tablet Take 40 mg by mouth daily.      . ALPRAZolam (XANAX) 0.5 MG tablet Take 0.5 mg by mouth at bedtime as needed for sleep.      . Venlafaxine HCl (EFFEXOR PO) Take 220 mg by mouth daily.        No current facility-administered medications for this visit.    Review of Systems Review of Systems  Respiratory: Negative.   Gastrointestinal: Positive for nausea, vomiting and abdominal pain.  Genitourinary: Negative.   Allergic/Immunologic: Negative for immunocompromised state.  Hematological: Negative.     Blood pressure 120/80, pulse 92, resp.  rate 14, height 5' 7" (1.702 m), weight 178 lb (80.74 kg).  Physical Exam Physical Exam  Constitutional: She appears well-developed and well-nourished. No distress.  HENT:  Head: Normocephalic and atraumatic.  Eyes: EOM are normal. No scleral icterus.  Cardiovascular: Normal rate and regular rhythm.   Pulmonary/Chest: Effort normal and breath sounds normal.  Abdominal: Soft. She exhibits no distension and no mass. There is no tenderness.  Small subumbilical scar. Lower transverse scar.  Musculoskeletal: She exhibits no edema.  Neurological: She is alert.  Skin: Skin is warm and dry.    Data Reviewed Old CT scan demonstrating cholelithiasis.  Assessment    Symptomatic cholelithiasis.     Plan    I recommended laparoscopic cholecystectomy.  I have explained the procedure, risks, and aftercare of cholecystectomy.  Risks include but are not limited to bleeding, infection, wound problems, anesthesia, diarrhea, bile leak, injury to common bile duct/liver/intestine.  She seems to understand and agrees to proceed.         Marcel Gary J 04/16/2012, 2:06 PM    

## 2012-04-24 NOTE — Progress Notes (Signed)
Demerol 12.5mg  IVP GIVEN AS ORDERED FOR SHIVERING.

## 2012-04-27 ENCOUNTER — Telehealth (INDEPENDENT_AMBULATORY_CARE_PROVIDER_SITE_OTHER): Payer: Self-pay | Admitting: General Surgery

## 2012-04-27 ENCOUNTER — Encounter (HOSPITAL_COMMUNITY): Payer: Self-pay | Admitting: General Surgery

## 2012-04-27 NOTE — Telephone Encounter (Signed)
Spoke with patient she is aware of po f/u appt 05/13/12

## 2012-05-07 ENCOUNTER — Telehealth (INDEPENDENT_AMBULATORY_CARE_PROVIDER_SITE_OTHER): Payer: Self-pay | Admitting: *Deleted

## 2012-05-07 NOTE — Telephone Encounter (Signed)
Attempted to call patient at both home and mobile number with no answer.  Wanted to let patient know that she can start strenuous activities on 05/08/12 as long as she doesn't have pain.

## 2012-05-13 ENCOUNTER — Encounter (INDEPENDENT_AMBULATORY_CARE_PROVIDER_SITE_OTHER): Payer: BC Managed Care – PPO | Admitting: General Surgery

## 2012-05-19 ENCOUNTER — Encounter (INDEPENDENT_AMBULATORY_CARE_PROVIDER_SITE_OTHER): Payer: Self-pay | Admitting: General Surgery

## 2012-05-19 ENCOUNTER — Ambulatory Visit (INDEPENDENT_AMBULATORY_CARE_PROVIDER_SITE_OTHER): Payer: BC Managed Care – PPO | Admitting: General Surgery

## 2012-05-19 VITALS — BP 122/70 | HR 100 | Temp 98.6°F | Resp 18 | Ht 67.0 in | Wt 171.0 lb

## 2012-05-19 DIAGNOSIS — Z9889 Other specified postprocedural states: Secondary | ICD-10-CM

## 2012-05-19 NOTE — Progress Notes (Signed)
Procedure:  Laparoscopic cholecystectomy with cholangiogram  Date:  04/24/2012  Pathology:  Cholelithiasis chronic cholecystitis  History:  She is here for her first postoperative visit and is doing well.  Exam: General- Is in NAD. Abdomen-soft, incisions are clean and intact the  Assessment:  Doing well post cholecystectomy. She is back to her usual activities.  Plan:  Lifelong low fat diet recommended. Return visit as needed.

## 2012-05-19 NOTE — Patient Instructions (Signed)
Activities as tolerated.  Life long lowfat diet recommended.

## 2014-02-21 ENCOUNTER — Other Ambulatory Visit: Payer: Self-pay | Admitting: Gastroenterology

## 2014-04-21 ENCOUNTER — Other Ambulatory Visit: Payer: Self-pay | Admitting: Obstetrics & Gynecology

## 2014-04-22 LAB — CYTOLOGY - PAP

## 2014-06-02 ENCOUNTER — Ambulatory Visit: Payer: Self-pay | Admitting: Podiatry

## 2015-05-29 DIAGNOSIS — Z6824 Body mass index (BMI) 24.0-24.9, adult: Secondary | ICD-10-CM | POA: Diagnosis not present

## 2015-05-29 DIAGNOSIS — Z1231 Encounter for screening mammogram for malignant neoplasm of breast: Secondary | ICD-10-CM | POA: Diagnosis not present

## 2015-05-29 DIAGNOSIS — Z01419 Encounter for gynecological examination (general) (routine) without abnormal findings: Secondary | ICD-10-CM | POA: Diagnosis not present

## 2015-08-15 DIAGNOSIS — F418 Other specified anxiety disorders: Secondary | ICD-10-CM | POA: Diagnosis not present

## 2015-08-15 DIAGNOSIS — J329 Chronic sinusitis, unspecified: Secondary | ICD-10-CM | POA: Diagnosis not present

## 2015-09-14 DIAGNOSIS — F418 Other specified anxiety disorders: Secondary | ICD-10-CM | POA: Diagnosis not present

## 2015-09-14 DIAGNOSIS — E785 Hyperlipidemia, unspecified: Secondary | ICD-10-CM | POA: Diagnosis not present

## 2015-09-14 DIAGNOSIS — J329 Chronic sinusitis, unspecified: Secondary | ICD-10-CM | POA: Diagnosis not present

## 2015-11-21 DIAGNOSIS — F329 Major depressive disorder, single episode, unspecified: Secondary | ICD-10-CM | POA: Diagnosis not present

## 2015-11-21 DIAGNOSIS — M545 Low back pain: Secondary | ICD-10-CM | POA: Diagnosis not present

## 2015-11-21 DIAGNOSIS — Z79899 Other long term (current) drug therapy: Secondary | ICD-10-CM | POA: Diagnosis not present

## 2015-11-21 DIAGNOSIS — F418 Other specified anxiety disorders: Secondary | ICD-10-CM | POA: Diagnosis not present

## 2015-11-21 DIAGNOSIS — Z0000 Encounter for general adult medical examination without abnormal findings: Secondary | ICD-10-CM | POA: Diagnosis not present

## 2015-11-21 DIAGNOSIS — E785 Hyperlipidemia, unspecified: Secondary | ICD-10-CM | POA: Diagnosis not present

## 2015-12-06 ENCOUNTER — Encounter: Payer: Self-pay | Admitting: Podiatry

## 2015-12-06 ENCOUNTER — Ambulatory Visit (INDEPENDENT_AMBULATORY_CARE_PROVIDER_SITE_OTHER): Payer: BLUE CROSS/BLUE SHIELD | Admitting: Podiatry

## 2015-12-06 ENCOUNTER — Ambulatory Visit (INDEPENDENT_AMBULATORY_CARE_PROVIDER_SITE_OTHER): Payer: BLUE CROSS/BLUE SHIELD

## 2015-12-06 VITALS — BP 116/73 | HR 80 | Resp 16 | Ht 67.0 in | Wt 158.0 lb

## 2015-12-06 DIAGNOSIS — G5762 Lesion of plantar nerve, left lower limb: Secondary | ICD-10-CM | POA: Diagnosis not present

## 2015-12-06 DIAGNOSIS — M79672 Pain in left foot: Secondary | ICD-10-CM

## 2015-12-06 DIAGNOSIS — G5782 Other specified mononeuropathies of left lower limb: Secondary | ICD-10-CM

## 2015-12-06 NOTE — Progress Notes (Signed)
   Subjective:    Patient ID: Carrie Holland, female    DOB: [birth year], 60 y.o.   MRN: [id number]  HPI Chief Complaint  Patient presents with  . Toe Pain    Left foot; 4th & 5th toe; pt stated, "Sometimes toes feel numb and throb; hurts to wear shoes"; x1 yr      Review of Systems  All other systems reviewed and are negative.      Objective:   Physical Exam        Assessment & Plan:

## 2015-12-06 NOTE — Patient Instructions (Signed)
Morton Neuralgia Introduction Morton neuralgia is a type of foot pain in the area closest to your toes. This area is sometimes called the ball of your foot. Morton neuralgia occurs when a branch of a nerve in your foot (digital nerve) becomes compressed. When this happens over a long period of time, the nerve can thicken (neuroma) and cause pain. This usually occurs between the third and fourth toe. Morton neuralgia can come and go but may get worse over time. What are the causes? Your digital nerve can become compressed and stretched at a point where it passes under a thick band of tissue that connects your toes (intermetatarsal ligament). Morton neuralgia can be caused by mild repetitive damage in this area. This type of damage can result from:  Activities such as running or jumping.  Wearing shoes that are too tight. What increases the risk? You may be at risk for Morton neuralgia if you:  Are female.  Wear high heels.  Wear shoes that are narrow or tight.  Participate in activities that stretch your toes. These include:  Running.  Ballet.  Long-distance walking. What are the signs or symptoms? The first symptom of Morton neuralgia is pain that spreads from the ball of your foot to your toes. It may feel like you are walking on a marble. Pain usually gets worse with walking and goes away at night. Other symptoms may include numbness and cramping of your toes. How is this diagnosed? Your health care provider will do a physical exam. When doing the exam, your health care provider may:  Squeeze your foot just behind your toe.  Ask you to move your toes to check for pain. You may also have tests on your foot to confirm the diagnosis. These may include:  An X-ray.  An MRI. How is this treated? Treatment for Morton neuralgia may be as simple as changing the kind of shoes you wear. Other treatments may include:  Wearing a supportive pad (orthosis) under the front of your foot.  This lifts your toe bones and takes pressure off the nerve.  Getting injections of numbing medicine and anti-inflammatory medicine (steroid) in the nerve.  Having surgery to remove part of the thickened nerve. Follow these instructions at home:  Take medicine only as directed by your health care provider.  Wear soft-soled shoes with a wide toe area.  Stop activities that may be causing pain.  Elevate your foot when resting.  Massage your foot.  Apply ice to the injured area:  Put ice in a plastic bag.  Place a towel between your skin and the bag.  Leave the ice on for 20 minutes, 2-3 times a day.  Keep all follow-up visits as directed by your health care provider. This is important. Contact a health care provider if:  Home care instructions are not helping you get better.  Your symptoms change or get worse. This information is not intended to replace advice given to you by your health care provider. Make sure you discuss any questions you have with your health care provider. Document Released: 04/15/2000 Document Revised: 06/15/2015 Document Reviewed: 03/10/2013  2017 [employer]vier  

## 2015-12-07 NOTE — Progress Notes (Signed)
Subjective:     Patient ID: Carrie Holland, female   DOB: 04/11/1955, 60 y.o.   MRN: [id number]  HPI patient states that she's had a lot of increased pain in her left foot over the last year and it feels like it burns at time is a hot poker sensation and it occurs mostly with ambulation or shoe gear   Review of Systems  All other systems reviewed and are negative.      Objective:   Physical Exam  Constitutional: She is oriented to person, place, and time.  Cardiovascular: Intact distal pulses.   Musculoskeletal: Normal range of motion.  Neurological: She is oriented to person, place, and time.  Skin: Skin is warm.  Nursing note and vitals reviewed.  neurovascular status intact muscle strength adequate range of motion within normal limits with patient found to have a very painful third interspace left with radiating shooting discomforts into the adjacent digits positive Mulder sign with palpable mass. Patient's found have good digital perfusion and is well oriented 3     Assessment:     Probability for neuroma symptomatology left that is causing increased symptoms over the last year    Plan:     H&P condition reviewed. At this point were in a try conservative and I recommended a purified alcohol Marcaine solution explaining that this may take a number of these to help the problem but hopefully could help about surgery but it's a very strong possibility that she will require nerve excision. I did a sterile prep of the left foot I then injected directly into the nerve root proximal to the metatarsal heads with a combination of a purified alcohol Marcaine solution which was tolerated well  X-ray report indicates that there is no indication stress fracture or arthritis and it appears to be a soft tissue issue

## 2015-12-08 DIAGNOSIS — M1811 Unilateral primary osteoarthritis of first carpometacarpal joint, right hand: Secondary | ICD-10-CM | POA: Diagnosis not present

## 2015-12-08 DIAGNOSIS — L820 Inflamed seborrheic keratosis: Secondary | ICD-10-CM | POA: Diagnosis not present

## 2015-12-08 DIAGNOSIS — M1812 Unilateral primary osteoarthritis of first carpometacarpal joint, left hand: Secondary | ICD-10-CM | POA: Diagnosis not present

## 2015-12-08 DIAGNOSIS — M180 Bilateral primary osteoarthritis of first carpometacarpal joints: Secondary | ICD-10-CM | POA: Diagnosis not present

## 2015-12-20 ENCOUNTER — Ambulatory Visit (INDEPENDENT_AMBULATORY_CARE_PROVIDER_SITE_OTHER): Payer: BLUE CROSS/BLUE SHIELD | Admitting: Podiatry

## 2015-12-20 DIAGNOSIS — G5762 Lesion of plantar nerve, left lower limb: Secondary | ICD-10-CM

## 2015-12-20 DIAGNOSIS — G5782 Other specified mononeuropathies of left lower limb: Secondary | ICD-10-CM

## 2015-12-21 NOTE — Progress Notes (Signed)
Subjective:     Patient ID: Carrie Holland, female   DOB: [birth year], 60 y.o.   MRN: [id number]  HPI patient states it's feeling quite a bit better with discomfort still present but improved from previous visit   Review of Systems     Objective:   Physical Exam Neurovascular status intact with continued discomfort third interspace left with mild to moderate radiating discomfort and shooting pains into the adjacent digits    Assessment:     Neuroma symptomatology present left with improvement from previous visit    Plan:     Hopeful we can avoid surgery and today I did do a proximal block and then utilized a purified alcohol Marcaine solution to sclerose the nerve and reappoint in 2 weeks

## 2016-01-03 ENCOUNTER — Encounter: Payer: Self-pay | Admitting: Podiatry

## 2016-01-03 ENCOUNTER — Ambulatory Visit (INDEPENDENT_AMBULATORY_CARE_PROVIDER_SITE_OTHER): Payer: BLUE CROSS/BLUE SHIELD | Admitting: Podiatry

## 2016-01-03 DIAGNOSIS — G5762 Lesion of plantar nerve, left lower limb: Secondary | ICD-10-CM | POA: Diagnosis not present

## 2016-01-03 DIAGNOSIS — G5782 Other specified mononeuropathies of left lower limb: Secondary | ICD-10-CM

## 2016-01-03 NOTE — Progress Notes (Signed)
Subjective:     Patient ID: Carrie Holland, female   DOB: 05-27-1955, 60 y.o.   MRN: [id number]  HPI patient presents stating I'm still having some radiating pain in my left foot but it's improved with the medication   Review of Systems     Objective:   Physical Exam Neurovascular status intact muscle strength adequate with inflammation around the third interspace left that still present but improved from previous visit    Assessment:     Neuroma symptomatology improved but present    Plan:     Continue conservative treatment and did a sterile prep left foot injected directly into the nerve root with a combination of purified alcohol Marcaine solution and reappoint in 2 weeks to reevaluate Carrie Holland ability for 1 more injection necessary

## 2016-01-24 ENCOUNTER — Encounter: Payer: Self-pay | Admitting: Podiatry

## 2016-01-24 ENCOUNTER — Ambulatory Visit (INDEPENDENT_AMBULATORY_CARE_PROVIDER_SITE_OTHER): Payer: BLUE CROSS/BLUE SHIELD | Admitting: Podiatry

## 2016-01-24 DIAGNOSIS — G5762 Lesion of plantar nerve, left lower limb: Secondary | ICD-10-CM | POA: Diagnosis not present

## 2016-01-24 DIAGNOSIS — M779 Enthesopathy, unspecified: Secondary | ICD-10-CM | POA: Diagnosis not present

## 2016-01-24 DIAGNOSIS — G5782 Other specified mononeuropathies of left lower limb: Secondary | ICD-10-CM

## 2016-01-24 NOTE — Progress Notes (Signed)
Subjective:     Patient ID: Carrie Holland, female   DOB: [birth year], 61 y.o.   MRN: [id number]  HPI patient states she continues to have pain in the left foot and she's not sure if it's the joint or the nerve but she does get some shooting and also throbbing pain   Review of Systems     Objective:   Physical Exam Neurovascular status intact with patient's third interspace left continuing to exhibit a positive Biagio Borg sign with a thick fibrous mass and pain when palpated and the foot is squeezed. 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 I do think ultimately it's can require resection and I did explained procedure and risk and the fact there is no guarantee this will solve the problem. She does want to do this at one point in future is just not sure of timing and today I did allow her to read a consent form going over alternative treatments complications and the fact this may not solve the problem. Patient signed consent form after extensive review and I did go ahead today and I did do a proximal neuro lysis injection consisting of Marcaine and purified alcohol 1.5 mL and also discussed capsular inflammation that I want her to pay attention to. Patient will call and we will decide what's can be best long-term for her

## 2016-02-29 DIAGNOSIS — H5203 Hypermetropia, bilateral: Secondary | ICD-10-CM | POA: Diagnosis not present

## 2016-02-29 DIAGNOSIS — H2513 Age-related nuclear cataract, bilateral: Secondary | ICD-10-CM | POA: Diagnosis not present

## 2016-02-29 DIAGNOSIS — H52203 Unspecified astigmatism, bilateral: Secondary | ICD-10-CM | POA: Diagnosis not present

## 2016-02-29 DIAGNOSIS — H524 Presbyopia: Secondary | ICD-10-CM | POA: Diagnosis not present

## 2016-05-09 DIAGNOSIS — M170 Bilateral primary osteoarthritis of knee: Secondary | ICD-10-CM | POA: Diagnosis not present

## 2016-05-09 DIAGNOSIS — E785 Hyperlipidemia, unspecified: Secondary | ICD-10-CM | POA: Diagnosis not present

## 2016-05-09 DIAGNOSIS — M545 Low back pain: Secondary | ICD-10-CM | POA: Diagnosis not present

## 2016-07-01 DIAGNOSIS — Z01419 Encounter for gynecological examination (general) (routine) without abnormal findings: Secondary | ICD-10-CM | POA: Diagnosis not present

## 2016-07-01 DIAGNOSIS — Z1231 Encounter for screening mammogram for malignant neoplasm of breast: Secondary | ICD-10-CM | POA: Diagnosis not present

## 2016-07-01 DIAGNOSIS — Z6824 Body mass index (BMI) 24.0-24.9, adult: Secondary | ICD-10-CM | POA: Diagnosis not present

## 2016-07-01 DIAGNOSIS — Z118 Encounter for screening for other infectious and parasitic diseases: Secondary | ICD-10-CM | POA: Diagnosis not present

## 2016-07-08 DIAGNOSIS — J209 Acute bronchitis, unspecified: Secondary | ICD-10-CM | POA: Diagnosis not present

## 2016-07-15 ENCOUNTER — Emergency Department (HOSPITAL_COMMUNITY)
Admission: EM | Admit: 2016-07-15 | Discharge: 2016-07-15 | Disposition: A | Discharge status: Home / Self Care | Payer: BLUE CROSS/BLUE SHIELD | Attending: Emergency Medicine | Admitting: Emergency Medicine

## 2016-07-15 ENCOUNTER — Encounter (HOSPITAL_COMMUNITY): Payer: Self-pay | Admitting: *Deleted

## 2016-07-15 ENCOUNTER — Emergency Department (HOSPITAL_COMMUNITY): Payer: BLUE CROSS/BLUE SHIELD

## 2016-07-15 DIAGNOSIS — Z87891 Personal history of nicotine dependence: Secondary | ICD-10-CM | POA: Diagnosis not present

## 2016-07-15 DIAGNOSIS — N200 Calculus of kidney: Secondary | ICD-10-CM

## 2016-07-15 DIAGNOSIS — N201 Calculus of ureter: Secondary | ICD-10-CM | POA: Diagnosis not present

## 2016-07-15 DIAGNOSIS — R109 Unspecified abdominal pain: Secondary | ICD-10-CM | POA: Diagnosis not present

## 2016-07-15 DIAGNOSIS — N132 Hydronephrosis with renal and ureteral calculous obstruction: Secondary | ICD-10-CM | POA: Diagnosis not present

## 2016-07-15 DIAGNOSIS — Z79899 Other long term (current) drug therapy: Secondary | ICD-10-CM | POA: Insufficient documentation

## 2016-07-15 LAB — COMPREHENSIVE METABOLIC PANEL
ALT: 26 U/L (ref 14–54)
AST: 33 U/L (ref 15–41)
Albumin: 4.7 g/dL (ref 3.5–5.0)
Alkaline Phosphatase: 92 U/L (ref 38–126)
Anion gap: 11 (ref 5–15)
BUN: 15 mg/dL (ref 6–20)
CHLORIDE: 101 mmol/L (ref 101–111)
CO2: 28 mmol/L (ref 22–32)
Calcium: 10.5 mg/dL — ABNORMAL HIGH (ref 8.9–10.3)
Creatinine, Ser: 0.99 mg/dL (ref 0.44–1.00)
Glucose, Bld: 121 mg/dL — ABNORMAL HIGH (ref 65–99)
POTASSIUM: 4 mmol/L (ref 3.5–5.1)
Sodium: 140 mmol/L (ref 135–145)
Total Bilirubin: 1.2 mg/dL (ref 0.3–1.2)
Total Protein: 7.3 g/dL (ref 6.5–8.1)

## 2016-07-15 LAB — CBC WITH DIFFERENTIAL/PLATELET
BASOS ABS: 0 10*3/uL (ref 0.0–0.1)
Basophils Relative: 0 %
EOS ABS: 0 10*3/uL (ref 0.0–0.7)
EOS PCT: 0 %
HCT: 42.4 % (ref 36.0–46.0)
Hemoglobin: 14.7 g/dL (ref 12.0–15.0)
LYMPHS PCT: 7 %
Lymphs Abs: 1.2 10*3/uL (ref 0.7–4.0)
MCH: 29.8 pg (ref 26.0–34.0)
MCHC: 34.7 g/dL (ref 30.0–36.0)
MCV: 85.8 fL (ref 78.0–100.0)
MONO ABS: 1.2 10*3/uL — AB (ref 0.1–1.0)
Monocytes Relative: 7 %
Neutro Abs: 15.3 10*3/uL — ABNORMAL HIGH (ref 1.7–7.7)
Neutrophils Relative %: 86 %
PLATELETS: 317 10*3/uL (ref 150–400)
RBC: 4.94 MIL/uL (ref 3.87–5.11)
RDW: 12.4 % (ref 11.5–15.5)
WBC: 17.8 10*3/uL — ABNORMAL HIGH (ref 4.0–10.5)

## 2016-07-15 LAB — LIPASE, BLOOD: LIPASE: 20 U/L (ref 11–51)

## 2016-07-15 LAB — URINALYSIS, ROUTINE W REFLEX MICROSCOPIC
BILIRUBIN URINE: NEGATIVE
Glucose, UA: NEGATIVE mg/dL
Ketones, ur: NEGATIVE mg/dL
LEUKOCYTES UA: NEGATIVE
NITRITE: NEGATIVE
PROTEIN: NEGATIVE mg/dL
Specific Gravity, Urine: 1.01 (ref 1.005–1.030)
pH: 8 (ref 5.0–8.0)

## 2016-07-15 MED ORDER — ONDANSETRON HCL 4 MG/2ML IJ SOLN
4.0000 mg | Freq: Once | INTRAMUSCULAR | Status: AC
  Administered 2016-07-15: 4 mg via INTRAVENOUS
  Filled 2016-07-15: qty 2

## 2016-07-15 MED ORDER — SODIUM CHLORIDE 0.9 % IV SOLN
Freq: Once | INTRAVENOUS | Status: AC
  Administered 2016-07-15: 16:00:00 via INTRAVENOUS

## 2016-07-15 MED ORDER — TAMSULOSIN HCL 0.4 MG PO CAPS: 0.4000 mg | ORAL_CAPSULE | Freq: Every day | ORAL | 0 refills | Status: DC

## 2016-07-15 MED ORDER — ONDANSETRON HCL 4 MG PO TABS: 4.0000 mg | ORAL_TABLET | Freq: Four times a day (QID) | ORAL | 0 refills | Status: DC

## 2016-07-15 MED ORDER — CIPROFLOXACIN HCL 500 MG PO TABS: 500.0000 mg | ORAL_TABLET | Freq: Once | ORAL | Status: DC

## 2016-07-15 MED ORDER — CIPROFLOXACIN IN D5W 400 MG/200ML IV SOLN
400.0000 mg | Freq: Once | INTRAVENOUS | Status: AC
  Administered 2016-07-15: 400 mg via INTRAVENOUS
  Filled 2016-07-15: qty 200

## 2016-07-15 MED ORDER — CIPROFLOXACIN HCL 500 MG PO TABS: 500.0000 mg | ORAL_TABLET | Freq: Two times a day (BID) | ORAL | 0 refills | Status: DC

## 2016-07-15 MED ORDER — HYDROMORPHONE HCL 1 MG/ML IJ SOLN
1.0000 mg | INTRAMUSCULAR | Status: AC | PRN
  Administered 2016-07-15 (×2): 1 mg via INTRAVENOUS
  Filled 2016-07-15 (×2): qty 1

## 2016-07-15 MED ORDER — MORPHINE SULFATE (PF) 4 MG/ML IV SOLN
4.0000 mg | INTRAVENOUS | Status: DC | PRN
Start: 2016-07-15 — End: 2016-07-15
  Administered 2016-07-15: 4 mg via INTRAVENOUS
  Filled 2016-07-15: qty 1

## 2016-07-15 MED ORDER — HYDROCODONE-ACETAMINOPHEN 5-325 MG PO TABS: 2.0000 | ORAL_TABLET | ORAL | 0 refills | Status: DC | PRN

## 2016-07-15 NOTE — ED Notes (Signed)
Patient transported to CT 

## 2016-07-15 NOTE — ED Provider Notes (Signed)
[facility] DEPT Provider Note   CSN: [id number] Arrival date & time: 07/15/16  1232     History   Chief Complaint Chief Complaint  Patient presents with  . Flank Pain    HPI Carrie Holland is a 61 y.o. female.Chief complaint is left flank pain.  HPI 61 year old female. One prior episode of ureteral stone. Had a 12 mm right ureteral stone that underwent lithotripsy with Dr.Wrenn in 2012.  She describes left flank pain that awakened her this morning. She states at times it is been as bad as 8/10. Nausea but no vomiting. States it is different than her previous pain from kidney stones. She has known diverticuli. However she has never had an episode of diverticulitis.   No fever. No dysuria or frequency. No bloody stools. No change in bowel habits.  Past Medical History:  Diagnosis Date  . Arthritis   . Depression   . Dysrhythmia    hx of pvc's   . FHx: migraine headaches   . Gallstones   . GERD (gastroesophageal reflux disease)   . Hiatal hernia   . Kidney stones   . PONV (postoperative nausea and vomiting)   . Tendonitis    right ankle     There are no active problems to display for this patient.   Past Surgical History:  Procedure Laterality Date  . [place]  . CHOLECYSTECTOMY N/A 04/24/2012   Procedure: LAPAROSCOPIC CHOLECYSTECTOMY WITH INTRAOPERATIVE CHOLANGIOGRAM;  Surgeon: Odis Hollingshead, MD;  Location: [facility] ORS;  Service: General;  Laterality: N/A;  . FOOT SURGERY  2013  . LITHOTRIPSY  2013  . NASAL SINUS SURGERY    . TUBAL LIGATION      OB History    No data available       Home Medications    Prior to Admission medications   Medication Sig Start Date End Date Taking? Authorizing Provider  acetaminophen (TYLENOL) 500 MG tablet Take 500 mg by mouth every 6 (six) hours as needed for pain.   Yes [provider]  ALPRAZolam Duanne Moron) 0.5 MG tablet Take 0.5 mg by mouth at bedtime as needed for sleep.   Yes  [provider]  atorvastatin (LIPITOR) 40 MG tablet Take 40 mg by mouth every evening.    Yes [provider]  celecoxib (CELEBREX) 100 MG capsule Take 100 mg by mouth at bedtime.   Yes [provider]  DULoxetine (CYMBALTA) 60 MG capsule Take 60 mg by mouth every evening.    Yes [provider]  ciprofloxacin (CIPRO) 500 MG tablet Take 1 tablet (500 mg total) by mouth 2 (two) times daily. 07/15/16   Tanna Furry, MD  HYDROcodone-acetaminophen (NORCO/VICODIN) 5-325 MG tablet Take 2 tablets by mouth every 4 (four) hours as needed. 07/15/16   Tanna Furry, MD  ondansetron (ZOFRAN) 4 MG tablet Take 1 tablet (4 mg total) by mouth every 6 (six) hours. 07/15/16   Tanna Furry, MD  tamsulosin (FLOMAX) 0.4 MG CAPS capsule Take 1 capsule (0.4 mg total) by mouth daily. 07/15/16   Tanna Furry, MD    Family History Family History  Problem Relation Age of Onset  . Heart disease Father   . Headache Father   . Lung disease Father   . Hepatitis Brother     Social History Social History  Substance Use Topics  . Smoking status: Former Research scientist (life sciences)  . Smokeless tobacco: Never Used  . Alcohol use Yes     Comment: socially  Allergies   Ceclor [cefaclor] and Penicillins   Review of Systems Review of Systems  Constitutional: Negative for appetite change, chills, diaphoresis, fatigue and fever.  HENT: Negative for mouth sores, sore throat and trouble swallowing.   Eyes: Negative for visual disturbance.  Respiratory: Negative for cough, chest tightness, shortness of breath and wheezing.   Cardiovascular: Negative for chest pain.  Gastrointestinal: Positive for abdominal pain and nausea. Negative for abdominal distention, diarrhea and vomiting.  Endocrine: Negative for polydipsia, polyphagia and polyuria.  Genitourinary: Positive for flank pain. Negative for dysuria, frequency and hematuria.  Musculoskeletal: Negative for gait problem.  Skin: Negative for color change,  pallor and rash.  Neurological: Negative for dizziness, syncope, light-headedness and headaches.  Hematological: Does not bruise/bleed easily.  Psychiatric/Behavioral: Negative for behavioral problems and confusion.     Physical Exam Updated Vital Signs BP 126/72 (BP Location: Right Arm)   Pulse 76   Temp 98.1 F (36.7 C) (Oral)   Resp 18   Ht 5\' 7"  (1.702 m)   Wt 72.1 kg (159 lb)   SpO2 99%   BMI 24.90 kg/m   Physical Exam  Constitutional: She is oriented to person, place, and time. She appears well-developed and well-nourished. No distress.  HENT:  Head: Normocephalic.  Eyes: Conjunctivae are normal. Pupils are equal, round, and reactive to light. No scleral icterus.  Neck: Normal range of motion. Neck supple. No thyromegaly present.  Cardiovascular: Normal rate and regular rhythm.  Exam reveals no gallop and no friction rub.   No murmur heard. Pulmonary/Chest: Effort normal and breath sounds normal. No respiratory distress. She has no wheezes. She has no rales.  Abdominal: Soft. Bowel sounds are normal. She exhibits no distension. There is no tenderness. There is no rebound.  Nontender to examine. No pain with pelvic patient or cough.  Musculoskeletal: Normal range of motion.  Neurological: She is alert and oriented to person, place, and time.  Skin: Skin is warm and dry. No rash noted.  Psychiatric: She has a normal mood and affect. Her behavior is normal.     ED Treatments / Results  Labs (all labs ordered are listed, but only abnormal results are displayed) Labs Reviewed  URINALYSIS, ROUTINE W REFLEX MICROSCOPIC - Abnormal; Notable for the following:       Result Value   APPearance CLOUDY (*)    Hgb urine dipstick SMALL (*)    Bacteria, UA FEW (*)    Squamous Epithelial / LPF 0-5 (*)    All other components within normal limits  CBC WITH DIFFERENTIAL/PLATELET - Abnormal; Notable for the following:    WBC 17.8 (*)    Neutro Abs 15.3 (*)    Monocytes Absolute  1.2 (*)    All other components within normal limits  COMPREHENSIVE METABOLIC PANEL - Abnormal; Notable for the following:    Glucose, Bld 121 (*)    Calcium 10.5 (*)    All other components within normal limits  URINE CULTURE  LIPASE, BLOOD    EKG  EKG Interpretation None       Radiology Ct Renal Stone Study  Result Date: 07/15/2016 CLINICAL DATA:  Left flank pain and history of nephrolithiasis. EXAM: CT ABDOMEN AND PELVIS WITHOUT CONTRAST TECHNIQUE: Multidetector CT imaging of the abdomen and pelvis was performed following the standard protocol without IV contrast. COMPARISON:  Abdominal radiograph 06/08/2013 FINDINGS: Lower chest: No pulmonary nodules or pleural effusion. No visible pericardial effusion. There is a left breast implant. Hepatobiliary: Normal noncontrast appearance of  the liver. No visible biliary dilatation. Normal gallbladder. Pancreas: Normal noncontrast appearance of the pancreas. No peripancreatic fluid collection. Spleen: Normal. Adrenals/Urinary Tract: --Adrenal glands: Normal. --Right kidney/ureter: No hydronephrosis or perinephric stranding. No nephrolithiasis. No obstructing ureteral stones. --Left kidney/ureter: There is mild left hydronephrosis and moderate perinephric stranding. The proximal ureter is dilated. There is a 5 x 3 x 6 mm obstructing stone within the proximal left ureter. No other nephrolithiasis. --Urinary bladder: Unremarkable. Stomach/Bowel: There is no hiatal hernia. The stomach and duodenum are normal. There is no dilated small bowel or enteric inflammation. There is no colonic abnormality. The appendix is normal. Vascular/Lymphatic: There is atherosclerotic calcification of the non aneurysmal abdominal aorta. No lymphadenopathy. Reproductive: Normal uterus.  No adnexal mass. Musculoskeletal. Multilevel lumbar vertebral body height loss with Schmorl's nodes. No acute abnormality. No bony spinal canal stenosis. IMPRESSION: 1. Left-sided obstructive  uropathy with 5 x 3 x 6 mm stone in the proximal left ureter causing mild left hydronephrosis with moderate perinephric stranding. 2. No other nephrolithiasis. 3.  Aortic Atherosclerosis (ICD10-I70.0). Electronically Signed   By: Ulyses Jarred M.D.   On: 07/15/2016 16:57    Procedures Procedures (including critical care time)  Medications Ordered in ED Medications  0.9 %  sodium chloride infusion ( Intravenous Stopped 07/15/16 1923)  ondansetron (ZOFRAN) injection 4 mg (4 mg Intravenous Given 07/15/16 1615)  HYDROmorphone (DILAUDID) injection 1 mg (1 mg Intravenous Given 07/15/16 1912)  ciprofloxacin (CIPRO) IVPB 400 mg (0 mg Intravenous Stopped 07/15/16 1839)     Initial Impression / Assessment and Plan / ED Course  I have reviewed the triage vital signs and the nursing notes.  Pertinent labs & imaging results that were available during my care of the patient were reviewed by me and considered in my medical decision making (see chart for details).    Few rbc's, few WBCs. Few bacteria. Elevated white count. Afebrile. CT shows 6 mm left proximal ureteral stone with some perinephric stranding and hydronephrosis. IV is placed. Patient given IV or faint, Zofran. Urine is cultured. Given IV Cipro. I discussed the case with Dr.Dhalstedt of urology. Her renal function is intact. She does not show obvious infection. Symptoms are controlled. She will be discharged. Urine culture sent. We'll continue on Cipro, Flomax, Percocet, and follow up with [facility] if not improving in the next 5-7 days. ER with acute changes, vomiting, fever.  Final Clinical Impressions(s) / ED Diagnoses   Final diagnoses:  Kidney stone    New Prescriptions Discharge Medication List as of 07/15/2016  6:11 PM    START taking these medications   Details  ciprofloxacin (CIPRO) 500 MG tablet Take 1 tablet (500 mg total) by mouth 2 (two) times daily., Starting Mon 07/15/2016, Print    HYDROcodone-acetaminophen  (NORCO/VICODIN) 5-325 MG tablet Take 2 tablets by mouth every 4 (four) hours as needed., Starting Mon 07/15/2016, Print    ondansetron (ZOFRAN) 4 MG tablet Take 1 tablet (4 mg total) by mouth every 6 (six) hours., Starting Mon 07/15/2016, Print    tamsulosin (FLOMAX) 0.4 MG CAPS capsule Take 1 capsule (0.4 mg total) by mouth daily., Starting Mon 07/15/2016, Print         Tanna Furry, MD 07/15/16 [phone]

## 2016-07-15 NOTE — ED Triage Notes (Signed)
Patient is alert and oriented x4.  She is complaining of left flank pain that started when she woke up this morning. .  Currently she rates her pain 8 of 10 with nausea.  Patient has a Hx of kidney stones.

## 2016-07-17 LAB — URINE CULTURE

## 2016-07-18 ENCOUNTER — Telehealth: Payer: Self-pay | Admitting: *Deleted

## 2016-07-18 DIAGNOSIS — N132 Hydronephrosis with renal and ureteral calculous obstruction: Secondary | ICD-10-CM | POA: Diagnosis not present

## 2016-07-18 DIAGNOSIS — N201 Calculus of ureter: Secondary | ICD-10-CM | POA: Diagnosis not present

## 2016-07-18 NOTE — Telephone Encounter (Signed)
Post ED Visit - Positive Culture Follow-up: Successful Patient Follow-Up  Culture assessed and recommendations reviewed by: []  Elenor Quinones, Pharm.D. []  Heide Guile, Pharm.D., BCPS AQ-ID []  Parks Neptune, Pharm.D., BCPS []  Alycia Rossetti, Pharm.D., BCPS []  [place], [place].D., BCPS, AAHIVP []  Legrand Como, Pharm.D., BCPS, AAHIVP []  Salome Arnt, PharmD, BCPS []  Dimitri Ped, PharmD, BCPS []  Vincenza Hews, PharmD, BCPS  Positive urine culture  []  Patient discharged without antimicrobial prescription and treatment is now indicated [x]  Organism is resistant to prescribed ED discharge antimicrobial, referred to [facility] for continued flank pain, D/C Cipro []  Patient with positive blood cultures  Changes discussed with ED provider Providence Lanius, PA-C   Contacted patient, date 07/18/2016, time [place], [place] 07/18/2016, 10:03 AM

## 2016-07-18 NOTE — Progress Notes (Signed)
ED Antimicrobial Stewardship Positive Culture Follow Up   Carrie Holland is an 61 y.o. female who presented to [facility] on 07/15/2016 with a chief complaint of  Chief Complaint  Patient presents with  . Flank Pain    Recent Results (from the past 720 hour(s))  Urine Culture     Status: Abnormal   Collection Time: 07/15/16  4:00 PM  Result Value Ref Range Status   Specimen Description URINE, CLEAN CATCH  Final   Special Requests NONE  Final   Culture (A)  Final    10,000 COLONIES/mL STAPHYLOCOCCUS SPECIES (COAGULASE NEGATIVE)   Report Status 07/17/2016 FINAL  Final   Organism ID, Bacteria STAPHYLOCOCCUS SPECIES (COAGULASE NEGATIVE) (A)  Final      Susceptibility   Staphylococcus species (coagulase negative) - MIC*    CIPROFLOXACIN >=8 RESISTANT Resistant     GENTAMICIN <=0.5 SENSITIVE Sensitive     NITROFURANTOIN <=16 SENSITIVE Sensitive     OXACILLIN >=4 RESISTANT Resistant     TETRACYCLINE 2 SENSITIVE Sensitive     VANCOMYCIN 1 SENSITIVE Sensitive     TRIMETH/SULFA 80 RESISTANT Resistant     CLINDAMYCIN >=8 RESISTANT Resistant     RIFAMPIN <=0.5 SENSITIVE Sensitive     Inducible Clindamycin NEGATIVE Sensitive     * 10,000 COLONIES/mL STAPHYLOCOCCUS SPECIES (COAGULASE NEGATIVE)   Dc cipro If symptoms unresolved results will be called to [facility]  ED Provider: Providence Lanius, PA-C  Wynell Balloon 07/18/2016, 9:17 AM Infectious Diseases Pharmacist Phone# [phone]

## 2016-07-29 DIAGNOSIS — N201 Calculus of ureter: Secondary | ICD-10-CM | POA: Diagnosis not present

## 2016-08-23 DIAGNOSIS — N201 Calculus of ureter: Secondary | ICD-10-CM | POA: Diagnosis not present

## 2016-09-30 DIAGNOSIS — N201 Calculus of ureter: Secondary | ICD-10-CM | POA: Diagnosis not present

## 2016-10-10 ENCOUNTER — Other Ambulatory Visit: Payer: Self-pay | Admitting: Urology

## 2016-10-15 ENCOUNTER — Encounter (HOSPITAL_COMMUNITY): Payer: Self-pay | Admitting: *Deleted

## 2016-10-15 ENCOUNTER — Encounter (HOSPITAL_COMMUNITY)
Admission: RE | Admit: 2016-10-15 | Discharge: 2016-10-15 | Disposition: A | Discharge status: Home / Self Care | Payer: BLUE CROSS/BLUE SHIELD | Source: Ambulatory Visit | Attending: Urology | Admitting: Urology

## 2016-10-15 DIAGNOSIS — I493 Ventricular premature depolarization: Secondary | ICD-10-CM | POA: Diagnosis not present

## 2016-10-15 DIAGNOSIS — Z888 Allergy status to other drugs, medicaments and biological substances status: Secondary | ICD-10-CM | POA: Diagnosis not present

## 2016-10-15 DIAGNOSIS — E7800 Pure hypercholesterolemia, unspecified: Secondary | ICD-10-CM | POA: Diagnosis not present

## 2016-10-15 DIAGNOSIS — N201 Calculus of ureter: Secondary | ICD-10-CM | POA: Diagnosis not present

## 2016-10-15 DIAGNOSIS — Z87891 Personal history of nicotine dependence: Secondary | ICD-10-CM | POA: Diagnosis not present

## 2016-10-15 DIAGNOSIS — F419 Anxiety disorder, unspecified: Secondary | ICD-10-CM | POA: Diagnosis not present

## 2016-10-15 DIAGNOSIS — Z79899 Other long term (current) drug therapy: Secondary | ICD-10-CM | POA: Diagnosis not present

## 2016-10-15 DIAGNOSIS — Z86010 Personal history of colonic polyps: Secondary | ICD-10-CM | POA: Diagnosis not present

## 2016-10-15 DIAGNOSIS — Z880 Allergy status to penicillin: Secondary | ICD-10-CM | POA: Diagnosis not present

## 2016-10-16 NOTE — H&P (Signed)
CC: I have a ureteral stone.  HPI: Carrie Holland is a 61 year-old female established patient who is here for a ureteral calculus.  09/30/16: Patient returns today for follow up. She continues to deny any current flank pain, gross hematuria, fever, or voiding symptoms. She does complain of intermittent episodes of generalized abdominal and lower back pain. She denies seeing a stone pass.   08/23/16: Patient returns today for follow up. She denies any current flank pain, gross hematuria, urgency, frequency, nausea, or vomiting. She has been straining her urine, but denies seeing a stone pass.   07/29/16: Carrie Holland returns today in f/u for a left ureteral stone. She saw Dr. Karsten Ro on 6/28 and had a 6 x 3.49mm left ureteral stone on CT. She continues to have some mild discomfort. She has no nausea, frequency, urgency or hematuria. She has not seen a stone pass. UA today was clear. Her stones were calcium.    07/18/16: This patient of Dr. Jeffie Pollock was emergently worked in today for a left ureteral stone. She was seen in the emergency room on 07/15/16 with acute onset left flank pain that she rated 8/10 at the time. It was associated with nausea but no vomiting. It was similar to prior stones that she had. A CT scan was obtained which revealed no renal calculi and a 3.5 mm stone in the proximal left ureter with some associated hydronephrosis.   The patient's stone is on her left side. She first noticed the symptoms 07/15/2016. This is not her first kidney stone. She is currently having back pain. She denies having flank pain, groin pain, nausea, vomiting, fever, and chills. She does not have a burning sensation when she urinates. She has not caught a stone in her urine strainer since her symptoms began.   She has had ESWL for treatment of her stones in the past.     ALLERGIES: Ceclor CAPS Penicillins    MEDICATIONS: Atorvastatin Calcium 40 mg tablet Oral  Celebrex  Cymbalta  Oxycodone Hcl 10 mg tablet  1-2 tablet PO Q 4 H  Tylenol TABS 0 Oral  Xanax 0.5 MG Oral Tablet Oral     GU PSH: D&C Non-OB - 2012 ESWL - 2012      PSH Notes: Cholecystectomy, Foot Surgery, Lithotripsy, Breast Surgery, Dilation And Curettage, Cesarean Section, Sinus Surgery, Cesarean Section, Sinus Surgery, Tubal Ligation   NON-GU PSH: Breast Surgery Procedure - 2012 Cesarean Delivery Only - 2012, 2012 Cholecystectomy (open) - 2015 Tubal Ligation - 2012    GU PMH: Ureteral calculus - 08/23/2016, She has minimal pain and a clear urine but the stone may still be at L4 on the left. I am going to have her return in 3 weeks for a KUB and renal US to reassess. , - 07/29/2016 (Acute), Left, She has a 3.5 mm stone in the left ureter. She has passed stones previously. I'm going to continue medical expulsive therapy and give her a little bit stronger pain medication., - 07/18/2016, Distal Ureteral Stone On The Right, - 2014 Ureteral obstruction secondary to calculous (Acute), Left, She was noted to have dilatation of the collecting system proximal to the stone but it was not severe. - 07/18/2016 Abdominal Pain Unspec, Right flank pain - 2015 History of urolithiasis, Nephrolithiasis - 2014 Low back pain, Lumbago - 2014 Urinary Tract Inf, Unspec site, Pyuria - 2014      PMH Notes:  2012-11-14 05:28:38 - Note: Cholelithiasis  2010-12-19 15:50:05 - Note: Nephrolithiasis Of The Right Kidney  NON-GU PMH: Encounter for general adult medical examination without abnormal findings, Encounter for preventive health examination - 2015 Anxiety, Anxiety (Symptom) - 2014 Benign neoplasm of sigmoid colon, Polyps Of The Sigmoid Colon - 2014 Personal history of other endocrine, nutritional and metabolic disease, History of hypercholesterolemia - 2014 Personal history of other mental and behavioral disorders, History of depression - 2014    FAMILY HISTORY: 2 sons - Runs in [place] Status Number - Runs In Family   SOCIAL  HISTORY: Marital Status: Married Preferred Language: English; Ethnicity: Not Hispanic Or Latino; Race: White Current Smoking Status: Patient does not smoke anymore.   Tobacco Use Assessment Completed: Used Tobacco in last 30 days? Drinks 2 caffeinated drinks per day.     Notes: Former smoker, Former Research scientist (life sciences), Alcohol Use, Caffeine Use, Marital History - Currently Married, Occupation:   REVIEW OF SYSTEMS:    GU Review Female:   Patient denies frequent urination, hard to postpone urination, burning /pain with urination, get up at night to urinate, leakage of urine, stream starts and stops, trouble starting your stream, have to strain to urinate, and being pregnant.  Gastrointestinal (Upper):   Patient reports indigestion/ heartburn. Patient denies nausea and vomiting.  Gastrointestinal (Lower):   Patient denies diarrhea and constipation.  Constitutional:   Patient denies fever, night sweats, weight loss, and fatigue.  Skin:   Patient denies skin rash/ lesion and itching.  Eyes:   Patient denies blurred vision and double vision.  Ears/ Nose/ Throat:   Patient denies sore throat and sinus problems.  Hematologic/Lymphatic:   Patient denies swollen glands and easy bruising.  Cardiovascular:   Patient denies leg swelling and chest pains.  Respiratory:   Patient denies cough and shortness of breath.  Endocrine:   Patient denies excessive thirst.  Musculoskeletal:   Patient reports back pain. Patient denies joint pain.  Neurological:   Patient denies headaches and dizziness.  Psychologic:   Patient reports anxiety. Patient denies depression.   VITAL SIGNS:      09/30/2016 09:35 AM  BP 124/77 mmHg  Pulse 82 /min  Temperature 97.9 F / 36.6 C   MULTI-SYSTEM PHYSICAL EXAMINATION:    Constitutional: Well-nourished. No physical deformities. Normally developed. Good grooming.  Respiratory: Normal breath sounds. No labored breathing, no use of accessory muscles.   Cardiovascular: Regular rate and  rhythm. No murmur, no gallop. Normal temperature, normal extremity pulses, no swelling, no varicosities.   Skin: No paleness, no jaundice, no cyanosis. No lesion, no ulcer, no rash.  Neurologic / Psychiatric: Oriented to time, oriented to place, oriented to person. No depression, no anxiety, no agitation.  Gastrointestinal: No mass, no tenderness, no rigidity, non obese abdomen.  Musculoskeletal: Spine, ribs, pelvis no bilateral tenderness. Normal gait and station of head and neck.     PAST DATA REVIEWED:  Source Of History:  Patient  Records Review:   Previous Patient Records  Urine Test Review:   Urinalysis  X-Ray Review: KUB: Reviewed Films.  Renal Ultrasound: Reviewed Films.     PROCEDURES:         KUB - [id number]  A single view of the abdomen is obtained. There are no obvious opacities noted within he confines of bilateral renal shadows. There are no obvious opacities noted along expected anatomical tract of the right ureter. There is an opacity noted along the expected anatomical tract of the left ureter at the level of L4 that may represent ureteral stone. Due to patient body habitus and overlying bowel  gas pattern, other small renal or ureteral stones cannot be completely excluded. No bony abnormalities.                 Urinalysis Dipstick Dipstick Cont'd  Color: Yellow Bilirubin: Neg  Appearance: Clear Ketones: Neg  Specific Gravity: 1.020 Blood: Neg  pH: 5.5 Protein: Trace  Glucose: Neg Urobilinogen: 0.2    Nitrites: Neg    Leukocyte Esterase: Neg    ASSESSMENT:      ICD-10 Details  1 GU:   Ureteral calculus - N20.1    PLAN:           Schedule Return Visit/Planned Activity: 1 Month - Office Visit, Extender          Document Letter(s):  Created for Patient: Clinical Summary         Notes:   - Urinalysis today is clear.  - KUB continues to show opacity along the expected anatomical tract of left ureter at the level of L4, which may represent a ureteral stone.  However, she continues to have minimal symptoms today.  - I will review images and discuss her case with her urologist. I will inform her of his recommendations.  - I will tentatively plan to have her return in one month.  - Return precautions given for fever or progressive pain, nausea or vomiting.            APPENDED NOTES:  Images reviewed with Dr. Jeffie Pollock. He feels opacity noted represents stone noted on C.T in June. He recommended moving forward with treatment. For ESWL we discussed indications and risks including bleeding, infection, pain, hematoma, need for blood transfusion, incomplete fragmentation of stone, need for additional procedure, or arrhythmia. For ureteroscopy I described the risks which include heart attack, stroke, pulmonary embolus, death, bleeding, infection, damage to contiguous structures, positioning injury, ureteral stricture, ureteral avulsion, ureteral injury, need for ureteral stent, inability to perform ureteroscopy, need for an interval procedure, inability to clear stone burden, stent discomfort and pain. She would like to move forward with left ESWL. I will fill out posting sheet.

## 2016-10-17 ENCOUNTER — Ambulatory Visit (HOSPITAL_COMMUNITY): Payer: BLUE CROSS/BLUE SHIELD

## 2016-10-17 ENCOUNTER — Ambulatory Visit (HOSPITAL_COMMUNITY)
Admission: RE | Admit: 2016-10-17 | Discharge: 2016-10-17 | Disposition: A | Discharge status: Home / Self Care | Payer: BLUE CROSS/BLUE SHIELD | Source: Ambulatory Visit | Attending: Urology | Admitting: Urology

## 2016-10-17 ENCOUNTER — Encounter (HOSPITAL_COMMUNITY)
Admission: RE | Disposition: A | Discharge status: Home / Self Care | Payer: Self-pay | Source: Ambulatory Visit | Attending: Urology

## 2016-10-17 DIAGNOSIS — E7800 Pure hypercholesterolemia, unspecified: Secondary | ICD-10-CM | POA: Insufficient documentation

## 2016-10-17 DIAGNOSIS — Z880 Allergy status to penicillin: Secondary | ICD-10-CM | POA: Insufficient documentation

## 2016-10-17 DIAGNOSIS — F419 Anxiety disorder, unspecified: Secondary | ICD-10-CM | POA: Insufficient documentation

## 2016-10-17 DIAGNOSIS — Z79899 Other long term (current) drug therapy: Secondary | ICD-10-CM | POA: Insufficient documentation

## 2016-10-17 DIAGNOSIS — N201 Calculus of ureter: Secondary | ICD-10-CM

## 2016-10-17 DIAGNOSIS — I493 Ventricular premature depolarization: Secondary | ICD-10-CM | POA: Diagnosis not present

## 2016-10-17 DIAGNOSIS — Z888 Allergy status to other drugs, medicaments and biological substances status: Secondary | ICD-10-CM | POA: Insufficient documentation

## 2016-10-17 DIAGNOSIS — Z87891 Personal history of nicotine dependence: Secondary | ICD-10-CM | POA: Insufficient documentation

## 2016-10-17 DIAGNOSIS — Z86010 Personal history of colonic polyps: Secondary | ICD-10-CM | POA: Diagnosis not present

## 2016-10-17 HISTORY — PX: EXTRACORPOREAL SHOCK WAVE LITHOTRIPSY: SHX1557

## 2016-10-17 SURGERY — LITHOTRIPSY, ESWL
Anesthesia: LOCAL | Laterality: Left

## 2016-10-17 MED ORDER — DIPHENHYDRAMINE HCL 25 MG PO CAPS
25.0000 mg | ORAL_CAPSULE | ORAL | Status: AC
  Administered 2016-10-17: 25 mg via ORAL
  Filled 2016-10-17: qty 1

## 2016-10-17 MED ORDER — SODIUM CHLORIDE 0.9% FLUSH: 3.0000 mL | INTRAVENOUS | Status: DC | PRN

## 2016-10-17 MED ORDER — SODIUM CHLORIDE 0.9 % IV SOLN
INTRAVENOUS | Status: DC
  Administered 2016-10-17: 09:00:00 via INTRAVENOUS

## 2016-10-17 MED ORDER — DIAZEPAM 5 MG PO TABS
10.0000 mg | ORAL_TABLET | ORAL | Status: AC
  Administered 2016-10-17: 10 mg via ORAL
  Filled 2016-10-17: qty 2

## 2016-10-17 MED ORDER — TAMSULOSIN HCL 0.4 MG PO CAPS: 0.4000 mg | ORAL_CAPSULE | Freq: Every day | ORAL | 0 refills | Status: DC

## 2016-10-17 MED ORDER — CIPROFLOXACIN HCL 500 MG PO TABS
500.0000 mg | ORAL_TABLET | ORAL | Status: AC
  Administered 2016-10-17: 500 mg via ORAL
  Filled 2016-10-17: qty 1

## 2016-10-17 MED ORDER — MORPHINE SULFATE (PF) 4 MG/ML IV SOLN: 2.0000 mg | INTRAVENOUS | Status: DC | PRN

## 2016-10-17 MED ORDER — SODIUM CHLORIDE 0.9% FLUSH: 3.0000 mL | Freq: Two times a day (BID) | INTRAVENOUS | Status: DC

## 2016-10-17 MED ORDER — SODIUM CHLORIDE 0.9 % IV SOLN: 250.0000 mL | INTRAVENOUS | Status: DC | PRN

## 2016-10-17 MED ORDER — ACETAMINOPHEN 325 MG PO TABS
650.0000 mg | ORAL_TABLET | ORAL | Status: DC | PRN
  Administered 2016-10-17: 650 mg via ORAL
  Filled 2016-10-17: qty 2

## 2016-10-17 MED ORDER — ACETAMINOPHEN 650 MG RE SUPP
650.0000 mg | RECTAL | Status: DC | PRN
  Filled 2016-10-17: qty 1

## 2016-10-17 MED ORDER — ONDANSETRON HCL 4 MG PO TABS: 4.0000 mg | ORAL_TABLET | Freq: Three times a day (TID) | ORAL | 0 refills | Status: DC | PRN

## 2016-10-17 MED ORDER — OXYCODONE HCL 5 MG PO TABS: 5.0000 mg | ORAL_TABLET | ORAL | Status: DC | PRN

## 2016-10-17 NOTE — Discharge Instructions (Signed)
Lithotripsy, Care After °This sheet gives you information about how to care for yourself after your procedure. Your health care provider may also give you more specific instructions. If you have problems or questions, contact your health care provider. °What can I expect after the procedure? °After the procedure, it is common to have: °· Some blood in your urine. This should only last for a few days. °· Soreness in your back, sides, or upper abdomen for a few days. °· Blotches or bruises on your back where the pressure wave entered the skin. °· Pain, discomfort, or nausea when pieces (fragments) of the kidney stone move through the tube that carries urine from the kidney to the bladder (ureter). Stone fragments may pass soon after the procedure, but they may continue to pass for up to 4-8 weeks. °? If you have severe pain or nausea, contact your health care provider. This may be caused by a large stone that was not broken up, and this may mean that you need more treatment. °· Some pain or discomfort during urination. °· Some pain or discomfort in the lower abdomen or (in men) at the base of the penis. ° °Follow these instructions at home: °Medicines °· Take over-the-counter and prescription medicines only as told by your health care provider. °· If you were prescribed an antibiotic medicine, take it as told by your health care provider. Do not stop taking the antibiotic even if you start to feel better. °· Do not drive for 24 hours if you were given a medicine to help you relax (sedative). °· Do not drive or use heavy machinery while taking prescription pain medicine. °Eating and drinking °· Drink enough water and fluids to keep your urine clear or pale yellow. This helps any remaining pieces of the stone to pass. It can also help prevent new stones from forming. °· Eat plenty of fresh fruits and vegetables. °· Follow instructions from your health care provider about eating and drinking restrictions. You may be  instructed: °? To reduce how much salt (sodium) you eat or drink. Check ingredients and nutrition facts on packaged foods and beverages. °? To reduce how much meat you eat. °· Eat the recommended amount of calcium for your age and gender. Ask your health care provider how much calcium you should have. °General instructions °· Get plenty of rest. °· Most people can resume normal activities 1-2 days after the procedure. Ask your health care provider what activities are safe for you. °· If directed, strain all urine through the strainer that was provided by your health care provider. °? Keep all fragments for your health care provider to see. Any stones that are found may be sent to a medical lab for examination. The stone may be as small as a grain of salt. °· Keep all follow-up visits as told by your health care provider. This is important. °Contact a health care provider if: °· You have pain that is severe or does not get better with medicine. °· You have nausea that is severe or does not go away. °· You have blood in your urine longer than your health care provider told you to expect. °· You have more blood in your urine. °· You have pain during urination that does not go away. °· You urinate more frequently than usual and this does not go away. °· You develop a rash or any other possible signs of an allergic reaction. °Get help right away if: °· You have severe pain in   your back, sides, or upper abdomen. °· You have severe pain while urinating. °· Your urine is very dark red. °· You have blood in your stool (feces). °· You cannot pass any urine at all. °· You feel a strong urge to urinate after emptying your bladder. °· You have a fever or chills. °· You develop shortness of breath, difficulty breathing, or chest pain. °· You have severe nausea that leads to persistent vomiting. °· You faint. °Summary °· After this procedure, it is common to have some pain, discomfort, or nausea when pieces (fragments) of the  kidney stone move through the tube that carries urine from the kidney to the bladder (ureter). If this pain or nausea is severe, however, you should contact your health care provider. °· Most people can resume normal activities 1-2 days after the procedure. Ask your health care provider what activities are safe for you. °· Drink enough water and fluids to keep your urine clear or pale yellow. This helps any remaining pieces of the stone to pass, and it can help prevent new stones from forming. °· If directed, strain your urine and keep all fragments for your health care provider to see. Fragments or stones may be as small as a grain of salt. °· Get help right away if you have severe pain in your back, sides, or upper abdomen or have severe pain while urinating. °This information is not intended to replace advice given to you by your health care provider. Make sure you discuss any questions you have with your health care provider. °Document Released: 01/27/2007 Document Revised: 11/29/2015 Document Reviewed: 11/29/2015 °Elsevier Interactive Patient Education © 2017 [employer] ° °

## 2016-10-21 ENCOUNTER — Encounter (HOSPITAL_COMMUNITY): Payer: Self-pay | Admitting: Urology

## 2016-10-31 DIAGNOSIS — N201 Calculus of ureter: Secondary | ICD-10-CM | POA: Diagnosis not present

## 2016-11-01 DIAGNOSIS — N201 Calculus of ureter: Secondary | ICD-10-CM | POA: Diagnosis not present

## 2016-11-13 DIAGNOSIS — N201 Calculus of ureter: Secondary | ICD-10-CM | POA: Diagnosis not present

## 2016-11-18 DIAGNOSIS — N201 Calculus of ureter: Secondary | ICD-10-CM | POA: Diagnosis not present

## 2016-11-18 DIAGNOSIS — N200 Calculus of kidney: Secondary | ICD-10-CM | POA: Diagnosis not present

## 2016-12-02 DIAGNOSIS — N201 Calculus of ureter: Secondary | ICD-10-CM | POA: Diagnosis not present

## 2016-12-02 DIAGNOSIS — N132 Hydronephrosis with renal and ureteral calculous obstruction: Secondary | ICD-10-CM | POA: Diagnosis not present

## 2016-12-05 ENCOUNTER — Other Ambulatory Visit: Payer: Self-pay | Admitting: Urology

## 2016-12-06 NOTE — Patient Instructions (Addendum)
Carrie Holland  12/06/2016   Your procedure is scheduled on: 12/17/2016    Report to [facility] Main  Entrance Take [place]  elevators to 3rd floor to  [place] at  0930  AM.    Call this number if you have problems the morning of surgery [phone]    Remember: ONLY 1 PERSON MAY GO WITH YOU TO SHORT STAY TO GET  READY MORNING OF [place].  Do not eat food or drink liquids :After Midnight.     Take these medicines the morning of surgery with A SIP OF WATER: none               You may not have any metal on your body including hair pins and              piercings  Do not wear jewelry, make-up, lotions, powders or perfumes, deodorant             Do not wear nail polish.  Do not shave  48 hours prior to surgery.            .   Do not bring valuables to the hospital. [place].  Contacts, dentures or bridgework may not be worn into surgery.       Patients discharged the day of surgery will not be allowed to drive home.  Name and phone number of your driver:  Special Instructions: N/A              Please read over the following fact sheets you were given: _____________________________________________________________________             [facility] - Preparing for Surgery Before surgery, you can play an important role.  Because skin is not sterile, your skin needs to be as free of germs as possible.  You can reduce the number of germs on your skin by washing with CHG (chlorahexidine gluconate) soap before surgery.  CHG is an antiseptic cleaner which kills germs and bonds with the skin to continue killing germs even after washing. Please DO NOT use if you have an allergy to CHG or antibacterial soaps.  If your skin becomes reddened/irritated stop using the CHG and inform your nurse when you arrive at Short Stay. Do not shave (including legs and underarms) for at least 48 hours prior to the  first CHG shower.  You may shave your face/neck. Please follow these instructions carefully:  1.  Shower with CHG Soap the night before surgery and the  morning of Surgery.  2.  If you choose to wash your hair, wash your hair first as usual with your  normal  shampoo.  3.  After you shampoo, rinse your hair and body thoroughly to remove the  shampoo.                           4.  Use CHG as you would any other liquid soap.  You can apply chg directly  to the skin and wash                       Gently with a scrungie or clean washcloth.  5.  Apply the CHG Soap to your body ONLY FROM  THE NECK DOWN.   Do not use on face/ open                           Wound or open sores. Avoid contact with eyes, ears mouth and genitals (private parts).                       Wash face,  Genitals (private parts) with your normal soap.             6.  Wash thoroughly, paying special attention to the area where your surgery  will be performed.  7.  Thoroughly rinse your body with warm water from the neck down.  8.  DO NOT shower/wash with your normal soap after using and rinsing off  the CHG Soap.                9.  Pat yourself dry with a clean towel.            10.  Wear clean pajamas.            11.  Place clean sheets on your bed the night of your first shower and do not  sleep with pets. Day of Surgery : Do not apply any lotions/deodorants the morning of surgery.  Please wear clean clothes to the hospital/surgery center.  FAILURE TO FOLLOW THESE INSTRUCTIONS MAY RESULT IN THE CANCELLATION OF YOUR SURGERY PATIENT SIGNATURE_________________________________  NURSE SIGNATURE__________________________________  ________________________________________________________________________

## 2016-12-09 ENCOUNTER — Encounter (HOSPITAL_COMMUNITY)
Admission: RE | Admit: 2016-12-09 | Discharge: 2016-12-09 | Disposition: A | Discharge status: Home / Self Care | Payer: BLUE CROSS/BLUE SHIELD | Source: Ambulatory Visit | Attending: Urology | Admitting: Urology

## 2016-12-10 NOTE — Patient Instructions (Addendum)
Carrie Holland  12/10/2016   Your procedure is scheduled on:  Tuesday, Nov. 27, 2018   Report to [facility]  Entrance   Take [place]  elevators to 3rd floor to  [place] at 9:30 AM.    Call this number if you have problems the morning of surgery [phone]    Remember: ONLY 1 PERSON MAY GO WITH YOU TO SHORT STAY TO GET  READY MORNING OF [place].   Do not eat food or drink liquids :After Midnight.   Do NOT smoke or vap after midnight    Take these medicines the morning of surgery with A SIP OF WATER: None              You may not have any metal on your body including hair pins, jewelry, and body piercings              Do not wear make-up, lotions, powders, perfumes, or deodorant             Do not wear nail polish.  Do not shave  48 hours prior to surgery.              Do not bring valuables to the hospital. [place].   Contacts, dentures or bridgework may not be worn into surgery.   Patients discharged the day of surgery will not be allowed to drive home.   Name and phone number of your driver: Pilar Plate [phone]   Special Instructions: N/A              Please read over the following fact sheets you were given: _____________________________________________________________________             [facility] - Preparing for Surgery Before surgery, you can play an important role.  Because skin is not sterile, your skin needs to be as free of germs as possible.  You can reduce the number of germs on your skin by washing with CHG (chlorahexidine gluconate) soap before surgery.  CHG is an antiseptic cleaner which kills germs and bonds with the skin to continue killing germs even after washing. Please DO NOT use if you have an allergy to CHG or antibacterial soaps.  If your skin becomes reddened/irritated stop using the CHG and inform your nurse when you arrive at Short Stay. Do not  shave (including legs and underarms) for at least 48 hours prior to the first CHG shower.  You may shave your face/neck.  Please follow these instructions carefully:  1.  Shower with CHG Soap the night before surgery and the  morning of surgery.  2.  If you choose to wash your hair, wash your hair first as usual with your normal  shampoo.  3.  After you shampoo, rinse your hair and body thoroughly to remove the shampoo.                             4.  Use CHG as you would any other liquid soap.  You can apply chg directly to the skin and wash.  Gently with a scrungie or clean washcloth.  5.  Apply the CHG Soap to your body ONLY FROM THE NECK DOWN.   Do   not use  on face/ open                           Wound or open sores. Avoid contact with eyes, ears mouth and   genitals (private parts).                       Wash face,  Genitals (private parts) with your normal soap.             6.  Wash thoroughly, paying special attention to the area where your    surgery  will be performed.  7.  Thoroughly rinse your body with warm water from the neck down.  8.  DO NOT shower/wash with your normal soap after using and rinsing off the CHG Soap.                9.  Pat yourself dry with a clean towel.            10.  Wear clean pajamas.            11.  Place clean sheets on your bed the night of your first shower and do not  sleep with pets. Day of Surgery : Do not apply any lotions/deodorants the morning of surgery.  Please wear clean clothes to the hospital/surgery center.  FAILURE TO FOLLOW THESE INSTRUCTIONS MAY RESULT IN THE CANCELLATION OF YOUR SURGERY  PATIENT SIGNATURE_________________________________  NURSE SIGNATURE__________________________________  ________________________________________________________________________

## 2016-12-10 NOTE — Pre-Procedure Instructions (Signed)
The following are in epic: EKG 10/15/16 XRAY of abd 10/17/16 CT renal stone 07/15/16

## 2016-12-16 ENCOUNTER — Encounter (HOSPITAL_COMMUNITY): Payer: Self-pay

## 2016-12-16 ENCOUNTER — Other Ambulatory Visit: Payer: Self-pay

## 2016-12-16 ENCOUNTER — Ambulatory Visit (HOSPITAL_COMMUNITY)
Admission: RE | Admit: 2016-12-16 | Discharge: 2016-12-16 | Disposition: A | Discharge status: Home / Self Care | Payer: BLUE CROSS/BLUE SHIELD | Source: Ambulatory Visit | Attending: Urology | Admitting: Urology

## 2016-12-16 ENCOUNTER — Encounter (INDEPENDENT_AMBULATORY_CARE_PROVIDER_SITE_OTHER): Payer: Self-pay

## 2016-12-16 ENCOUNTER — Encounter (HOSPITAL_COMMUNITY)
Admission: RE | Admit: 2016-12-16 | Discharge: 2016-12-16 | Disposition: A | Discharge status: Home / Self Care | Payer: BLUE CROSS/BLUE SHIELD | Source: Ambulatory Visit | Attending: Urology | Admitting: Urology

## 2016-12-16 DIAGNOSIS — M1990 Unspecified osteoarthritis, unspecified site: Secondary | ICD-10-CM | POA: Diagnosis not present

## 2016-12-16 DIAGNOSIS — F329 Major depressive disorder, single episode, unspecified: Secondary | ICD-10-CM | POA: Diagnosis not present

## 2016-12-16 DIAGNOSIS — N209 Urinary calculus, unspecified: Secondary | ICD-10-CM

## 2016-12-16 DIAGNOSIS — Z01818 Encounter for other preprocedural examination: Secondary | ICD-10-CM | POA: Insufficient documentation

## 2016-12-16 DIAGNOSIS — N201 Calculus of ureter: Secondary | ICD-10-CM | POA: Insufficient documentation

## 2016-12-16 DIAGNOSIS — N200 Calculus of kidney: Secondary | ICD-10-CM | POA: Diagnosis not present

## 2016-12-16 DIAGNOSIS — Z01812 Encounter for preprocedural laboratory examination: Secondary | ICD-10-CM | POA: Diagnosis not present

## 2016-12-16 DIAGNOSIS — K219 Gastro-esophageal reflux disease without esophagitis: Secondary | ICD-10-CM | POA: Diagnosis not present

## 2016-12-16 DIAGNOSIS — Z87891 Personal history of nicotine dependence: Secondary | ICD-10-CM | POA: Diagnosis not present

## 2016-12-16 DIAGNOSIS — F419 Anxiety disorder, unspecified: Secondary | ICD-10-CM | POA: Diagnosis not present

## 2016-12-16 HISTORY — DX: Lesion of plantar nerve, left lower limb: G57.62

## 2016-12-16 HISTORY — DX: Headache: R51

## 2016-12-16 HISTORY — DX: Headache, unspecified: R51.9

## 2016-12-16 LAB — CBC
HCT: 42.1 % (ref 36.0–46.0)
HEMOGLOBIN: 14.4 g/dL (ref 12.0–15.0)
MCH: 30.1 pg (ref 26.0–34.0)
MCHC: 34.2 g/dL (ref 30.0–36.0)
MCV: 87.9 fL (ref 78.0–100.0)
Platelets: 321 10*3/uL (ref 150–400)
RBC: 4.79 MIL/uL (ref 3.87–5.11)
RDW: 12.4 % (ref 11.5–15.5)
WBC: 6.8 10*3/uL (ref 4.0–10.5)

## 2016-12-16 NOTE — H&P (Signed)
CC: I have a ureteral stone.  HPI: Carrie Holland is a 61 year-old female established patient who is here for a ureteral calculus.  12/02/16: Patient returns today for follow up. She denies passing and she was seen last. She continues to remain relatively asymptomatic, but does have some intermittent left lower quadrant pain at times. She denies any dysuria, gross hematuria, fever, nausea, or vomiting.   11/18/16: Patient returns today for follow-up. She denies passing any stone fragments and she was seen last. She does complain of some intermittent left lower quadrant pain, which she describes as a pinching sensation. She states this began about 4 days ago. She denies any flank pain, gross hematuria, dysuria, fever, nausea, or vomiting. She denies any increased urgency or frequency.   10/31/16: Patient underwent left ESWL on 9/24. She returns today for follow up. She has only passed minimal small fragments. She continues to be asymptomatic. She denies any flank pain or abdominal pain. She denies exacerbation of voiding symptoms, dysuria, gross hematuria, nausea, vomiting, fever, or chills.    09/30/16: Patient returns today for follow up. She continues to deny any current flank pain, gross hematuria, fever, or voiding symptoms. She does complain of intermittent episodes of generalized abdominal and lower back pain. She denies seeing a stone pass.   08/23/16: Patient returns today for follow up. She denies any current flank pain, gross hematuria, urgency, frequency, nausea, or vomiting. She has been straining her urine, but denies seeing a stone pass.   07/29/16: Carrie Holland returns today in f/u for a left ureteral stone. She saw Dr. Karsten Ro on 6/28 and had a 6 x 3.74mm left ureteral stone on CT. She continues to have some mild discomfort. She has no nausea, frequency, urgency or hematuria. She has not seen a stone pass. UA today was clear. Her stones were calcium.    07/18/16: This patient of Dr.  Jeffie Pollock was emergently worked in today for a left ureteral stone. She was seen in the emergency room on 07/15/16 with acute onset left flank pain that she rated 8/10 at the time. It was associated with nausea but no vomiting. It was similar to prior stones that she had. A CT scan was obtained which revealed no renal calculi and a 3.5 mm stone in the proximal left ureter with some associated hydronephrosis.   The patient's stone was on her left side. She first noticed the symptoms 07/15/2016. This is not her first kidney stone. She is not currently having flank pain, back pain, groin pain, nausea, vomiting, fever or chills. She does not have a burning sensation when she urinates.   She has had ESWL for treatment of her stones in the past.     ALLERGIES: Ceclor CAPS Penicillins    MEDICATIONS: Atorvastatin Calcium 40 mg tablet Oral  Celebrex  Cymbalta  Oxycodone Hcl 10 mg tablet 1-2 tablet PO Q 4 H  Tylenol TABS 0 Oral  Xanax 0.5 MG Oral Tablet Oral     GU PSH: D&C Non-OB - 2012 ESWL - 10/17/2016, 2012      PSH Notes: Cholecystectomy, Foot Surgery, Lithotripsy, Breast Surgery, Dilation And Curettage, Cesarean Section, Sinus Surgery, Cesarean Section, Sinus Surgery, Tubal Ligation   NON-GU PSH: Breast Surgery Procedure - 2012 Cesarean Delivery Only - 2012, 2012 Cholecystectomy (open) - 2015 Tubal Ligation - 2012    GU PMH: Ureteral calculus - 11/18/2016, - 10/31/2016, - 09/30/2016, - 08/23/2016, She has minimal pain and a clear urine but the stone may still be  at L4 on the left. I am going to have her return in 3 weeks for a KUB and renal US to reassess. , - 07/29/2016 (Acute), Left, She has a 3.5 mm stone in the left ureter. She has passed stones previously. I'm going to continue medical expulsive therapy and give her a little bit stronger pain medication., - 07/18/2016, Distal Ureteral Stone On The Right, - 2014 Ureteral obstruction secondary to calculous (Acute), Left, She was noted to have  dilatation of the collecting system proximal to the stone but it was not severe. - 07/18/2016 Abdominal Pain Unspec, Right flank pain - 2015 History of urolithiasis, Nephrolithiasis - 2014 Low back pain, Lumbago - 2014 Urinary Tract Inf, Unspec site, Pyuria - 2014      PMH Notes:  2012-11-14 05:28:38 - Note: Cholelithiasis  2010-12-19 15:50:05 - Note: Nephrolithiasis Of The Right Kidney   NON-GU PMH: Encounter for general adult medical examination without abnormal findings, Encounter for preventive health examination - 2015 Anxiety, Anxiety (Symptom) - 2014 Benign neoplasm of sigmoid colon, Polyps Of The Sigmoid Colon - 2014 Personal history of other endocrine, nutritional and metabolic disease, History of hypercholesterolemia - 2014 Personal history of other mental and behavioral disorders, History of depression - 2014    FAMILY HISTORY: 2 sons - Runs in Family Death - Father Family Health Status Number - Runs In Family   SOCIAL HISTORY: Marital Status: Married Preferred Language: English; Ethnicity: Not Hispanic Or Latino; Race: White Current Smoking Status: Patient does not smoke anymore.   Tobacco Use Assessment Completed: Used Tobacco in last 30 days? Drinks 2 caffeinated drinks per day.     Notes: Former smoker, Former Research scientist (life sciences), Alcohol Use, Caffeine Use, Marital History - Currently Married, Occupation:   REVIEW OF SYSTEMS:    GU Review Female:   Patient denies frequent urination, hard to postpone urination, burning /pain with urination, get up at night to urinate, leakage of urine, stream starts and stops, trouble starting your stream, have to strain to urinate, and being pregnant.  Gastrointestinal (Upper):   Patient denies nausea, vomiting, and indigestion/ heartburn.  Gastrointestinal (Lower):   Patient denies diarrhea and constipation.  Constitutional:   Patient denies fever, night sweats, weight loss, and fatigue.  Skin:   Patient denies skin rash/ lesion and itching.   Eyes:   Patient denies blurred vision and double vision.  Ears/ Nose/ Throat:   Patient denies sore throat and sinus problems.  Hematologic/Lymphatic:   Patient denies swollen glands and easy bruising.  Cardiovascular:   Patient denies leg swelling and chest pains.  Respiratory:   Patient denies cough and shortness of breath.  Endocrine:   Patient denies excessive thirst.  Musculoskeletal:   Patient denies back pain and joint pain.  Neurological:   Patient denies headaches and dizziness.  Psychologic:   Patient denies depression and anxiety.   VITAL SIGNS:      12/02/2016 10:42 AM  BP 106/71 mmHg  Pulse 89 /min  Temperature 98.2 F / 36.7 C   MULTI-SYSTEM PHYSICAL EXAMINATION:    Constitutional: Well-nourished. No physical deformities. Normally developed. Good grooming.  Respiratory: Normal breath sounds. No labored breathing, no use of accessory muscles.   Cardiovascular: Regular rate and rhythm. No murmur, no gallop. Normal temperature, no swelling, no varicosities.   Skin: No paleness, no jaundice, no cyanosis. No lesion, no ulcer, no rash.  Neurologic / Psychiatric: Oriented to time, oriented to place, oriented to person. No depression, no anxiety, no agitation.  Gastrointestinal: No mass,  no tenderness, no rigidity, non obese abdomen.  Musculoskeletal: Spine, ribs, pelvis no bilateral tenderness. Normal gait and station of head and neck.     PAST DATA REVIEWED:  Source Of History:  Patient  Records Review:   Previous Patient Records  Urine Test Review:   Urinalysis, Urine Culture  X-Ray Review: KUB: Reviewed Films.  C.T. Stone Protocol: Reviewed Films. Reviewed Report.     PROCEDURES:         C.T. Urogram - [id number]  Impression:   1. Left-sided hydronephrosis and hydroureter secondary to 5 mm  distal left ureteral calculus.                KUB - [id number]  A single view of the abdomen is obtained. There are no obvious opacities noted along expected anatomical tract of the  right ureter. There continues to be an opacity noted in the vicinity of the left UVJ today that I feel may represent stone fragment. Stable pelvic calcifications. Due to overlying bowel gas pattern, other small renal or ureteral stones cannot be completely excluded. No bony abnormalities.                 Urinalysis - 81003 Dipstick Dipstick Cont'd  Color: Yellow Bilirubin: Neg  Appearance: Clear Ketones: Neg  Specific Gravity: 1.025 Blood: Neg  pH: 6.0 Protein: Neg  Glucose: Neg Urobilinogen: 0.2    Nitrites: Neg    Leukocyte Esterase: Neg    Notes:      ASSESSMENT:      ICD-10 Details  1 GU:   Ureteral calculus - N20.1    PLAN:           Orders Labs Urine Culture  X-Rays: C.T. Stone Protocol Without Contrast  X-Ray Notes: History:  Hematuria: Yes/No  Patient to see MD after exam: Yes/No  Previous exam: CT / IVP/ US/ KUB/ None  When:  Where:  Diabetic: Yes/ No  BUN/ Creatinine:  Date of last BUN Creatinine:  Weight in pounds:  Allergy- IV Contrast: Yes/ No  Conflicting diabetic meds: Yes/ No  Diabetic Meds:  Prior Authorization #: [id number]           Schedule Return Visit/Planned Activity: Other See Visit Notes          Document Letter(s):  Created for Patient: Clinical Summary         Notes:   I will send urine for culture today. KUB still continues to show an opacity which is concerning for residual stone fragment. We discussed treatment options in detail today. Patient would also like to move forward with repeating CT imaging prior to scheduling repeat intervention. Imaging shows approximately 5 mm stone in the left distal ureter with associated hydronephrosis. Given this progressed minimally, I feel she will likely need to be scheduled for intervention. Given location of the stone we discussed ureteroscopy procedure in detail. We discussed the risk which include anesthetic complications, bleeding, infection, pain, injury to the kidney, injury  to the ureter, inability to perform the procedure, need for additional procedures, likely need for stent placement and possible irritative symptoms related to that. She voiced understanding. I will discuss the case with her urologist and inform her of his recommendations. Return precautions given in the interim.

## 2016-12-17 ENCOUNTER — Encounter (HOSPITAL_COMMUNITY)
Admission: RE | Disposition: A | Discharge status: Home / Self Care | Payer: Self-pay | Source: Ambulatory Visit | Attending: Urology

## 2016-12-17 ENCOUNTER — Other Ambulatory Visit: Payer: Self-pay

## 2016-12-17 ENCOUNTER — Ambulatory Visit (HOSPITAL_COMMUNITY): Payer: BLUE CROSS/BLUE SHIELD

## 2016-12-17 ENCOUNTER — Ambulatory Visit (HOSPITAL_COMMUNITY): Payer: BLUE CROSS/BLUE SHIELD | Admitting: Certified Registered"

## 2016-12-17 ENCOUNTER — Encounter (HOSPITAL_COMMUNITY): Payer: Self-pay | Admitting: *Deleted

## 2016-12-17 ENCOUNTER — Ambulatory Visit (HOSPITAL_COMMUNITY)
Admission: RE | Admit: 2016-12-17 | Discharge: 2016-12-17 | Disposition: A | Discharge status: Home / Self Care | Payer: BLUE CROSS/BLUE SHIELD | Source: Ambulatory Visit | Attending: Urology | Admitting: Urology

## 2016-12-17 DIAGNOSIS — Z880 Allergy status to penicillin: Secondary | ICD-10-CM | POA: Insufficient documentation

## 2016-12-17 DIAGNOSIS — K219 Gastro-esophageal reflux disease without esophagitis: Secondary | ICD-10-CM | POA: Insufficient documentation

## 2016-12-17 DIAGNOSIS — F329 Major depressive disorder, single episode, unspecified: Secondary | ICD-10-CM | POA: Diagnosis not present

## 2016-12-17 DIAGNOSIS — Z79899 Other long term (current) drug therapy: Secondary | ICD-10-CM | POA: Diagnosis not present

## 2016-12-17 DIAGNOSIS — N201 Calculus of ureter: Secondary | ICD-10-CM | POA: Diagnosis not present

## 2016-12-17 DIAGNOSIS — Z87891 Personal history of nicotine dependence: Secondary | ICD-10-CM | POA: Insufficient documentation

## 2016-12-17 DIAGNOSIS — F419 Anxiety disorder, unspecified: Secondary | ICD-10-CM | POA: Diagnosis not present

## 2016-12-17 HISTORY — PX: CYSTOSCOPY WITH RETROGRADE PYELOGRAM, URETEROSCOPY AND STENT PLACEMENT: SHX5789

## 2016-12-17 SURGERY — CYSTOURETEROSCOPY, WITH RETROGRADE PYELOGRAM AND STENT INSERTION
Anesthesia: General | Laterality: Left

## 2016-12-17 MED ORDER — FENTANYL CITRATE (PF) 250 MCG/5ML IJ SOLN
INTRAMUSCULAR | Status: AC
  Filled 2016-12-17: qty 5

## 2016-12-17 MED ORDER — ONDANSETRON HCL 4 MG/2ML IJ SOLN
INTRAMUSCULAR | Status: AC
  Filled 2016-12-17: qty 2

## 2016-12-17 MED ORDER — DEXAMETHASONE SODIUM PHOSPHATE 10 MG/ML IJ SOLN
INTRAMUSCULAR | Status: AC
  Filled 2016-12-17: qty 1

## 2016-12-17 MED ORDER — HYDROMORPHONE HCL 1 MG/ML IJ SOLN: 0.2500 mg | INTRAMUSCULAR | Status: DC | PRN

## 2016-12-17 MED ORDER — SCOPOLAMINE 1 MG/3DAYS TD PT72
MEDICATED_PATCH | TRANSDERMAL | Status: AC
  Filled 2016-12-17: qty 1

## 2016-12-17 MED ORDER — FENTANYL CITRATE (PF) 250 MCG/5ML IJ SOLN
INTRAMUSCULAR | Status: DC | PRN
  Administered 2016-12-17 (×3): 50 ug via INTRAVENOUS

## 2016-12-17 MED ORDER — LIDOCAINE 2% (20 MG/ML) 5 ML SYRINGE
INTRAMUSCULAR | Status: DC | PRN
  Administered 2016-12-17: 50 mg via INTRAVENOUS

## 2016-12-17 MED ORDER — OXYCODONE HCL 5 MG/5ML PO SOLN
5.0000 mg | Freq: Once | ORAL | Status: DC | PRN
  Filled 2016-12-17: qty 5

## 2016-12-17 MED ORDER — PROPOFOL 10 MG/ML IV BOLUS
INTRAVENOUS | Status: AC
  Filled 2016-12-17: qty 20

## 2016-12-17 MED ORDER — PROPOFOL 10 MG/ML IV BOLUS
INTRAVENOUS | Status: DC | PRN
  Administered 2016-12-17: 200 mg via INTRAVENOUS
  Administered 2016-12-17: 100 mg via INTRAVENOUS

## 2016-12-17 MED ORDER — OXYCODONE-ACETAMINOPHEN 10-325 MG PO TABS: 1.0000 | ORAL_TABLET | Freq: Four times a day (QID) | ORAL | 0 refills | Status: DC | PRN

## 2016-12-17 MED ORDER — OXYCODONE HCL 5 MG PO TABS: 5.0000 mg | ORAL_TABLET | Freq: Once | ORAL | Status: DC | PRN

## 2016-12-17 MED ORDER — DEXAMETHASONE SODIUM PHOSPHATE 10 MG/ML IJ SOLN
INTRAMUSCULAR | Status: DC | PRN
  Administered 2016-12-17: 10 mg via INTRAVENOUS

## 2016-12-17 MED ORDER — LIDOCAINE 2% (20 MG/ML) 5 ML SYRINGE
INTRAMUSCULAR | Status: AC
  Filled 2016-12-17: qty 5

## 2016-12-17 MED ORDER — LACTATED RINGERS IV SOLN
INTRAVENOUS | Status: DC
  Administered 2016-12-17: 10:00:00 via INTRAVENOUS

## 2016-12-17 MED ORDER — MIDAZOLAM HCL 2 MG/2ML IJ SOLN
INTRAMUSCULAR | Status: AC
  Filled 2016-12-17: qty 2

## 2016-12-17 MED ORDER — IOHEXOL 300 MG/ML  SOLN
INTRAMUSCULAR | Status: DC | PRN
  Administered 2016-12-17: 3 mL via URETHRAL

## 2016-12-17 MED ORDER — SODIUM CHLORIDE 0.9 % IR SOLN
Status: DC | PRN
  Administered 2016-12-17: 4000 mL

## 2016-12-17 MED ORDER — SCOPOLAMINE 1 MG/3DAYS TD PT72
1.0000 | MEDICATED_PATCH | Freq: Once | TRANSDERMAL | Status: DC
  Administered 2016-12-17: 1.5 mg via TRANSDERMAL

## 2016-12-17 MED ORDER — MIDAZOLAM HCL 2 MG/2ML IJ SOLN
INTRAMUSCULAR | Status: DC | PRN
  Administered 2016-12-17: 2 mg via INTRAVENOUS

## 2016-12-17 MED ORDER — PROMETHAZINE HCL 25 MG/ML IJ SOLN: 6.2500 mg | INTRAMUSCULAR | Status: DC | PRN

## 2016-12-17 MED ORDER — CIPROFLOXACIN IN D5W 400 MG/200ML IV SOLN
400.0000 mg | INTRAVENOUS | Status: AC
  Administered 2016-12-17: 400 mg via INTRAVENOUS
  Filled 2016-12-17: qty 200

## 2016-12-17 MED ORDER — ONDANSETRON HCL 4 MG/2ML IJ SOLN
INTRAMUSCULAR | Status: DC | PRN
  Administered 2016-12-17: 4 mg via INTRAVENOUS

## 2016-12-17 SURGICAL SUPPLY — 24 items
BAG URO CATCHER STRL LF (MISCELLANEOUS) ×2 IMPLANT
BASKET STONE NCOMPASS (UROLOGICAL SUPPLIES) IMPLANT
CATH URET 5FR 28IN OPEN ENDED (CATHETERS) ×2 IMPLANT
CATH URET DUAL LUMEN 6-10FR 50 (CATHETERS) ×2 IMPLANT
CLOTH BEACON ORANGE TIMEOUT ST (SAFETY) ×2 IMPLANT
COVER FOOTSWITCH UNIV (MISCELLANEOUS) IMPLANT
COVER SURGICAL LIGHT HANDLE (MISCELLANEOUS) ×2 IMPLANT
EXTRACTOR STONE NITINOL NGAGE (UROLOGICAL SUPPLIES) ×2 IMPLANT
FIBER LASER FLEXIVA 1000 (UROLOGICAL SUPPLIES) IMPLANT
FIBER LASER FLEXIVA 365 (UROLOGICAL SUPPLIES) IMPLANT
FIBER LASER FLEXIVA 550 (UROLOGICAL SUPPLIES) IMPLANT
FIBER LASER TRAC TIP (UROLOGICAL SUPPLIES) IMPLANT
GLOVE SURG SS PI 8.0 STRL IVOR (GLOVE) ×2 IMPLANT
GOWN STRL REUS W/TWL XL LVL3 (GOWN DISPOSABLE) ×2 IMPLANT
GUIDEWIRE STR DUAL SENSOR (WIRE) ×2 IMPLANT
IV NS 1000ML (IV SOLUTION) ×1
IV NS 1000ML BAXH (IV SOLUTION) ×1 IMPLANT
IV NS IRRIG 3000ML ARTHROMATIC (IV SOLUTION) ×2 IMPLANT
MANIFOLD NEPTUNE II (INSTRUMENTS) ×2 IMPLANT
PACK CYSTO (CUSTOM PROCEDURE TRAY) ×2 IMPLANT
SHEATH ACCESS URETERAL 24CM (SHEATH) ×2 IMPLANT
SHEATH URETERAL 12FRX35CM (MISCELLANEOUS) IMPLANT
STENT URET 6FRX24 CONTOUR (STENTS) ×2 IMPLANT
TUBING CONNECTING 10 (TUBING) ×2 IMPLANT

## 2016-12-17 NOTE — Anesthesia Procedure Notes (Signed)
Date/Time: 12/17/2016 12:14 PM Performed by: Pilar Grammes, CRNA Pre-anesthesia Checklist: Patient identified Patient Re-evaluated:Patient Re-evaluated prior to induction Oxygen Delivery Method: Circle system utilized Preoxygenation: Pre-oxygenation with 100% oxygen Induction Type: IV induction Ventilation: Mask ventilation without difficulty LMA: LMA inserted LMA Size: 4.0 Number of attempts: 1 Tube secured with: Tape Dental Injury: Teeth and Oropharynx as per pre-operative assessment

## 2016-12-17 NOTE — Discharge Instructions (Signed)
Ureteral Stent Implantation, Care After Refer to this sheet in the next few weeks. These instructions provide you with information about caring for yourself after your procedure. Your health care provider may also give you more specific instructions. Your treatment has been planned according to current medical practices, but problems sometimes occur. Call your health care provider if you have any problems or questions after your procedure. What can I expect after the procedure? After the procedure, it is common to have:  Nausea.  Mild pain when you urinate. You may feel this pain in your lower back or lower abdomen. Pain should stop within a few minutes after you urinate. This may last for up to 1 week.  A small amount of blood in your urine for several days.  Follow these instructions at home:  Medicines  Take over-the-counter and prescription medicines only as told by your health care provider.  If you were prescribed an antibiotic medicine, take it as told by your health care provider. Do not stop taking the antibiotic even if you start to feel better.  Do not drive for 24 hours if you received a sedative.  Do not drive or operate heavy machinery while taking prescription pain medicines. Activity  Return to your normal activities as told by your health care provider. Ask your health care provider what activities are safe for you.  Do not lift anything that is heavier than 10 lb (4.5 kg). Follow this limit for 1 week after your procedure, or for as long as told by your health care provider. General instructions  Watch for any blood in your urine. Call your health care provider if the amount of blood in your urine increases.  If you have a catheter: ? Follow instructions from your health care provider about taking care of your catheter and collection bag. ? Do not take baths, swim, or use a hot tub until your health care provider approves.  Drink enough fluid to keep your urine  clear or pale yellow.  Keep all follow-up visits as told by your health care provider. This is important. Contact a health care provider if:  You have pain that gets worse or does not get better with medicine, especially pain when you urinate.  You have difficulty urinating.  You feel nauseous or you vomit repeatedly during a period of more than 2 days after the procedure. Get help right away if:  Your urine is dark red or has blood clots in it.  You are leaking urine (have incontinence).  The end of the stent comes out of your urethra.  You cannot urinate.  You have sudden, sharp, or severe pain in your abdomen or lower back.  You have a fever.  You may remove the stent on Friday by pulling the attached string that is tuck in the vaginal area.   If you don't feel comfortable pulling the string or can't find it, please call the office to come and have it removed on Friday.   This information is not intended to replace advice given to you by your health care provider. Make sure you discuss any questions you have with your health care provider. Document Released: 09/09/2012 Document Revised: 06/15/2015 Document Reviewed: 07/22/2014 Elsevier Interactive Patient Education  [employer].

## 2016-12-17 NOTE — Interval H&P Note (Signed)
History and Physical Interval Note:  12/17/2016 11:44 AM  Carrie Holland  has presented today for surgery, with the diagnosis of LEFT URETERAL STONE  The various methods of treatment have been discussed with the patient and family. After consideration of risks, benefits and other options for treatment, the patient has consented to  Procedure(s): CYSTOSCOPY WITH LEFT RETROGRADE LEFT URETEROSCOPY AND POSSIBLE STENT PLACEMENT (Left) HOLMIUM LASER APPLICATION (Left) as a surgical intervention .  The patient's history has been reviewed, patient examined, no change in status, stable for surgery.  I have reviewed the patient's chart and labs.  Questions were answered to the patient's satisfaction.     Irine Seal

## 2016-12-17 NOTE — Anesthesia Preprocedure Evaluation (Signed)
Anesthesia Evaluation  Patient identified by MRN, date of birth, ID band Patient awake    Reviewed: Allergy & Precautions, H&P , NPO status , Patient's Chart, lab work & pertinent test results  History of Anesthesia Complications (+) PONV  Airway Mallampati: II  TM Distance: >3 FB Neck ROM: Full    Dental no notable dental hx.    Pulmonary neg pulmonary ROS, former smoker,    Pulmonary exam normal breath sounds clear to auscultation       Cardiovascular negative cardio ROS Normal cardiovascular exam Rhythm:Regular Rate:Normal     Neuro/Psych Anxiety Depression negative neurological ROS     GI/Hepatic negative GI ROS, Neg liver ROS, GERD  ,  Endo/Other  negative endocrine ROS  Renal/GU negative Renal ROS  negative genitourinary   Musculoskeletal negative musculoskeletal ROS (+) Arthritis , Osteoarthritis,    Abdominal   Peds negative pediatric ROS (+)  Hematology negative hematology ROS (+)   Anesthesia Other Findings   Reproductive/Obstetrics negative OB ROS                             Anesthesia Physical  Anesthesia Plan  ASA: II  Anesthesia Plan: General   Post-op Pain Management:    Induction: Intravenous  PONV Risk Score and Plan: 3 and Ondansetron, Dexamethasone and Midazolam  Airway Management Planned: LMA  Additional Equipment:   Intra-op Plan:   Post-operative Plan: Extubation in OR  Informed Consent: I have reviewed the patients History and Physical, chart, labs and discussed the procedure including the risks, benefits and alternatives for the proposed anesthesia with the patient or authorized representative who has indicated his/her understanding and acceptance.   Dental advisory given  Plan Discussed with: CRNA and Surgeon  Anesthesia Plan Comments:         Anesthesia Quick Evaluation

## 2016-12-17 NOTE — Anesthesia Postprocedure Evaluation (Signed)
Anesthesia Post Note  Patient: CAMIE HAUSS  Procedure(s) Performed: CYSTOSCOPY WITH LEFT RETROGRADE LEFT URETEROSCOPY AND  STENT PLACEMENT (Left )     Patient location during evaluation: PACU Anesthesia Type: General Level of consciousness: awake and alert Pain management: pain level controlled Vital Signs Assessment: post-procedure vital signs reviewed and stable Respiratory status: spontaneous breathing, nonlabored ventilation and respiratory function stable Cardiovascular status: blood pressure returned to baseline and stable Postop Assessment: no apparent nausea or vomiting Anesthetic complications: no    Last Vitals:  Vitals:   12/17/16 1325 12/17/16 1357  BP: 137/86   Pulse: 93 (P) 85  Resp: 14 (P) 16  Temp: 36.8 C (P) 36.8 C  SpO2: 96% (P) 96%    Last Pain:  Vitals:   12/17/16 1357  TempSrc: (P) Oral                 Lynda Rainwater

## 2016-12-17 NOTE — Transfer of Care (Signed)
Immediate Anesthesia Transfer of Care Note  Patient: Carrie Holland  Procedure(s) Performed: CYSTOSCOPY WITH LEFT RETROGRADE LEFT URETEROSCOPY AND  STENT PLACEMENT (Left )  Patient Location: PACU  Anesthesia Type:General  Level of Consciousness: awake, alert  and patient cooperative  Airway & Oxygen Therapy: Patient connected to face mask oxygen  Post-op Assessment: Report given to RN and Post -op Vital signs reviewed and stable  Post vital signs: stable  Last Vitals:  Vitals:   12/17/16 0937  BP: 125/83  Pulse: 83  Resp: 16  Temp: 36.7 C  SpO2: 98%    Last Pain:  Vitals:   12/17/16 0937  TempSrc: Oral      Patients Stated Pain Goal: 4 (97/41/63 8453)  Complications: No apparent anesthesia complications

## 2016-12-17 NOTE — Op Note (Signed)
Procedure: 1.  Cystoscopy with left retrograde pyelogram and interpretation. 2.  Left ureteroscopy with stone extraction. 3.  Cystoscopy with insertion of left double-J stent.  Preoperative diagnosis: Left distal ureteral stone.  Postoperative diagnosis: Same  Surgeon: Dr. Irine Seal.   anesthesia: General.  Drain: 6 French by 24 cm left double-J stent.  Specimen: Stone fragment.  EBL: None.  Complications: None.  Indications: Ms. Carrie Holland is a 61 year old white female with a history of left ureteral stone that was initially treated with lithotripsy.  She has a residual left distal ureteral stone fragment approximately 5 mm in size that is failed to pass despite prolonged observation.  Procedure: She was taken to the operating room where general anesthetic was induced.  She was given Cipro.  She was placed in the lithotomy position.  She was fitted with PAS hose.  She was prepped with Betadine solution and draped in the usual sterile fashion.  Cystoscopy was performed using the 23 [place] scope with 30 degree lens.  Inspection revealed a normal bladder wall with mild trabeculation with the exception of mild edema around the left ureteral orifice.  The right ureteral orifice was unremarkable.  The left ureteral orifice was cannulated with a 5 French opening catheter.  Contrast was instilled.  Left retrograde pyelogram demonstrated a filling defect in the distal ureter consistent with the stone and some proximal dilation.  A guidewire was passed through the opening catheter to the kidney.  There was some resistance at the level of the stone.  The cystoscope was removed and a 12/14 French 25 cm access sheath was passed over the wire and the distal ureter was dilated.  A 6.5 French semirigid ureteroscope was then passed easily and inspection demonstrated ureteral inflammation at the level of the stone with mild mucosal tearing from the dilation.  The stone was in small fragments  which were then removed with a basket.  Once all stone fragments were removed and final inspection was performed, it was felt that the ureteral stent was appropriate.  The cystoscope was reinserted over the wire.  A 6 French 26 cm contour double-J stent with tether was passed over the wire to the kidney under fluoroscopic guidance.  The wire was removed and a good call was noted in the kidney and in the bladder.  The cystoscope was removed after the bladder was drained and the stent string was left exiting the urethra.  The string was tied close to the  meatus, trimmed and tucked vaginally.  Patient was taken down from the lithotomy position.  Her anesthetic was reversed.  She was moved to recovery room in stable condition.  There were no complications.

## 2016-12-24 DIAGNOSIS — N201 Calculus of ureter: Secondary | ICD-10-CM | POA: Diagnosis not present

## 2017-03-25 DIAGNOSIS — H5203 Hypermetropia, bilateral: Secondary | ICD-10-CM | POA: Diagnosis not present

## 2017-03-25 DIAGNOSIS — H524 Presbyopia: Secondary | ICD-10-CM | POA: Diagnosis not present

## 2017-03-25 DIAGNOSIS — H52203 Unspecified astigmatism, bilateral: Secondary | ICD-10-CM | POA: Diagnosis not present

## 2017-03-25 DIAGNOSIS — H2513 Age-related nuclear cataract, bilateral: Secondary | ICD-10-CM | POA: Diagnosis not present

## 2017-04-08 DIAGNOSIS — E559 Vitamin D deficiency, unspecified: Secondary | ICD-10-CM | POA: Diagnosis not present

## 2017-04-08 DIAGNOSIS — F418 Other specified anxiety disorders: Secondary | ICD-10-CM | POA: Diagnosis not present

## 2017-04-08 DIAGNOSIS — Z79899 Other long term (current) drug therapy: Secondary | ICD-10-CM | POA: Diagnosis not present

## 2017-04-08 DIAGNOSIS — Z0000 Encounter for general adult medical examination without abnormal findings: Secondary | ICD-10-CM | POA: Diagnosis not present

## 2017-04-08 DIAGNOSIS — M545 Low back pain: Secondary | ICD-10-CM | POA: Diagnosis not present

## 2017-04-08 DIAGNOSIS — E785 Hyperlipidemia, unspecified: Secondary | ICD-10-CM | POA: Diagnosis not present

## 2017-07-02 DIAGNOSIS — Z01419 Encounter for gynecological examination (general) (routine) without abnormal findings: Secondary | ICD-10-CM | POA: Diagnosis not present

## 2017-07-02 DIAGNOSIS — Z6829 Body mass index (BMI) 29.0-29.9, adult: Secondary | ICD-10-CM | POA: Diagnosis not present

## 2017-07-02 DIAGNOSIS — Z13820 Encounter for screening for osteoporosis: Secondary | ICD-10-CM | POA: Diagnosis not present

## 2017-07-02 DIAGNOSIS — Z1231 Encounter for screening mammogram for malignant neoplasm of breast: Secondary | ICD-10-CM | POA: Diagnosis not present

## 2017-07-14 DIAGNOSIS — L819 Disorder of pigmentation, unspecified: Secondary | ICD-10-CM | POA: Diagnosis not present

## 2017-07-14 DIAGNOSIS — D1801 Hemangioma of skin and subcutaneous tissue: Secondary | ICD-10-CM | POA: Diagnosis not present

## 2017-07-14 DIAGNOSIS — L814 Other melanin hyperpigmentation: Secondary | ICD-10-CM | POA: Diagnosis not present

## 2017-07-14 DIAGNOSIS — L570 Actinic keratosis: Secondary | ICD-10-CM | POA: Diagnosis not present

## 2017-09-25 DIAGNOSIS — H2513 Age-related nuclear cataract, bilateral: Secondary | ICD-10-CM | POA: Diagnosis not present

## 2017-10-08 DIAGNOSIS — H2511 Age-related nuclear cataract, right eye: Secondary | ICD-10-CM | POA: Diagnosis not present

## 2018-02-05 DIAGNOSIS — M545 Low back pain: Secondary | ICD-10-CM | POA: Diagnosis not present

## 2018-02-05 DIAGNOSIS — R109 Unspecified abdominal pain: Secondary | ICD-10-CM | POA: Diagnosis not present

## 2018-02-10 DIAGNOSIS — Z961 Presence of intraocular lens: Secondary | ICD-10-CM | POA: Diagnosis not present

## 2018-02-10 IMAGING — CT CT RENAL STONE PROTOCOL
2 of 3 series · 16 of 46 positions shown, 18 images · non-contrast
Comparison: Abdominal radiograph 06/08/2013

CLINICAL DATA: Left flank pain and history of nephrolithiasis.

EXAM:
CT ABDOMEN AND PELVIS WITHOUT CONTRAST
TECHNIQUE: Multidetector CT imaging of the abdomen and pelvis was performed
following the standard protocol without IV contrast.

[Series 3: lung · axial · 0.74mm/px · z∈[-81,+17]mm · 13 of 57 slices shown, 15 images]
[im 4/57  soft-tissue]
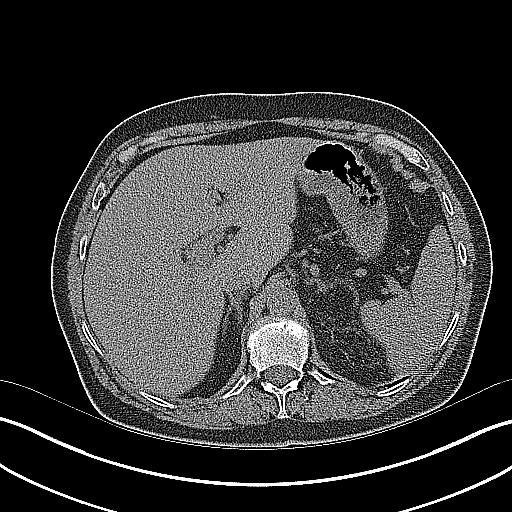
[im 4/57  bone]
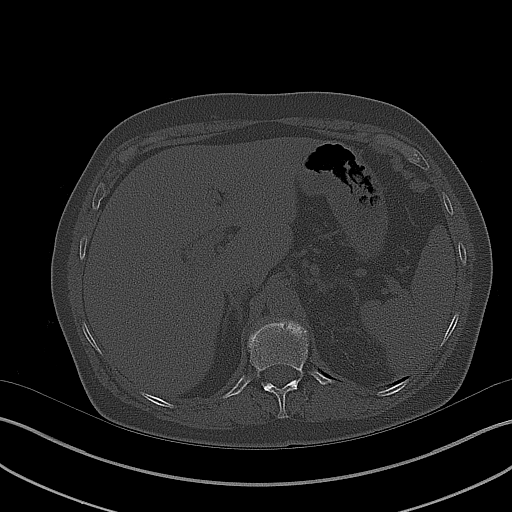
[im 8/57  soft-tissue]
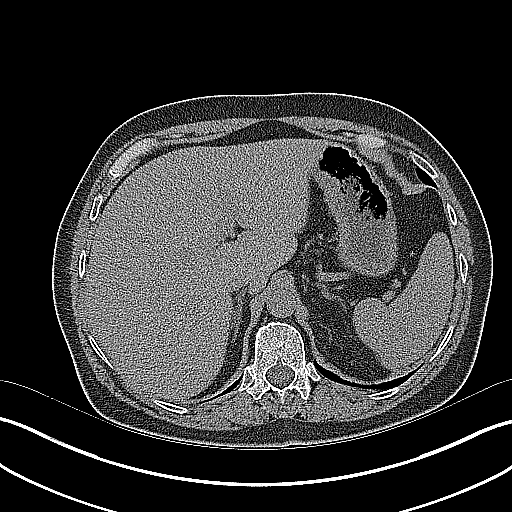
[im 11/57  soft-tissue]
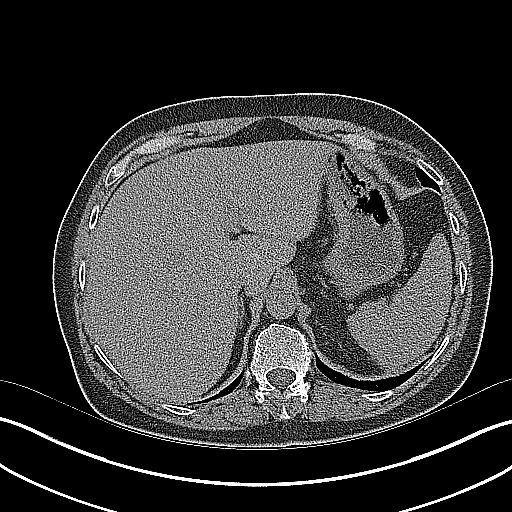
[im 17/57  soft-tissue]
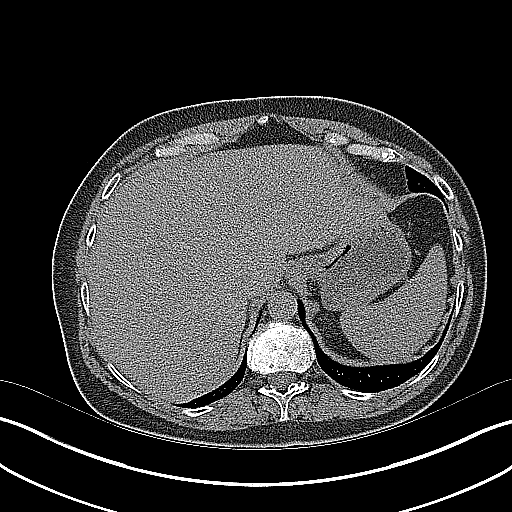
[im 20/57  soft-tissue]
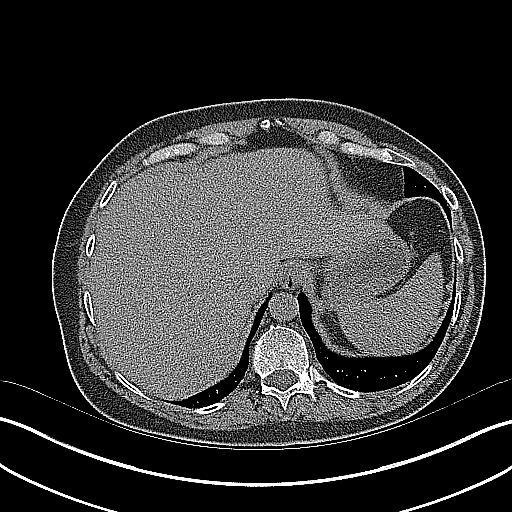
[im 24/57  soft-tissue]
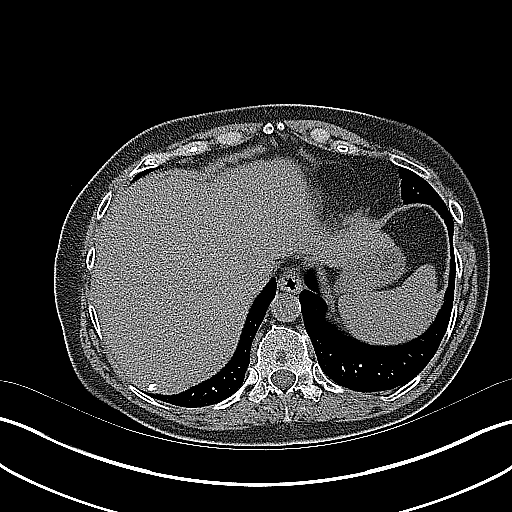
[im 29/57  soft-tissue]
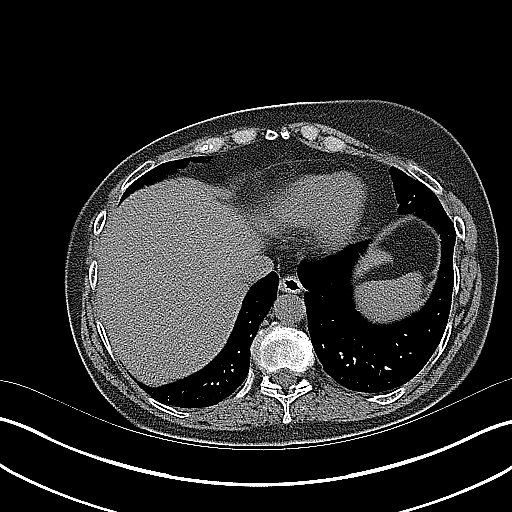
[im 33/57  soft-tissue]
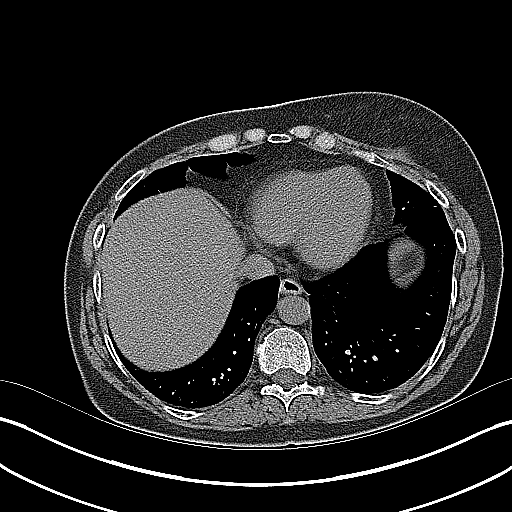
[im 37/57  soft-tissue]
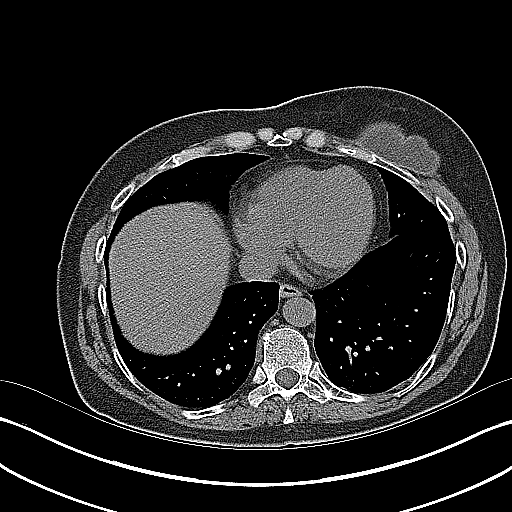
[im 37/57  bone]
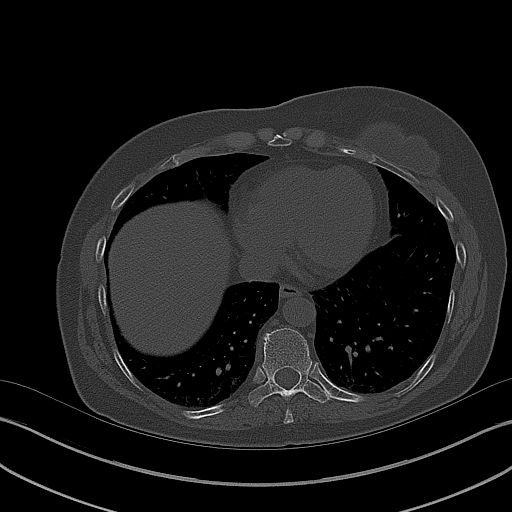
[im 40/57  soft-tissue]
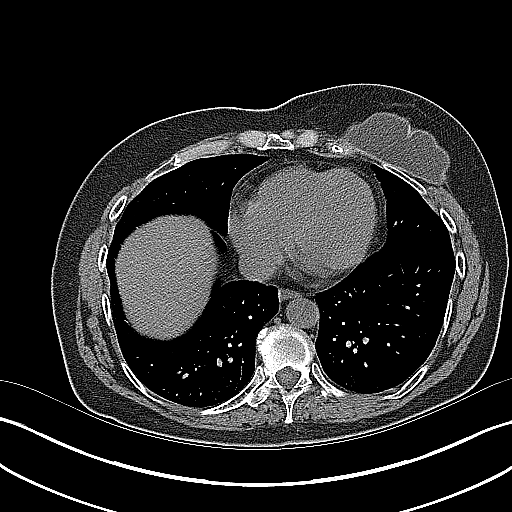
[im 46/57  soft-tissue]
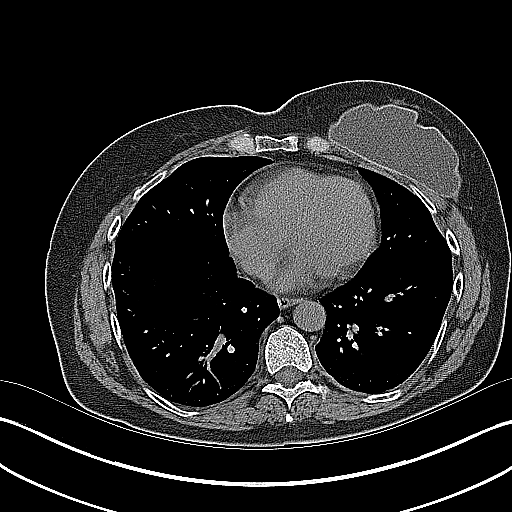
[im 49/57  soft-tissue]
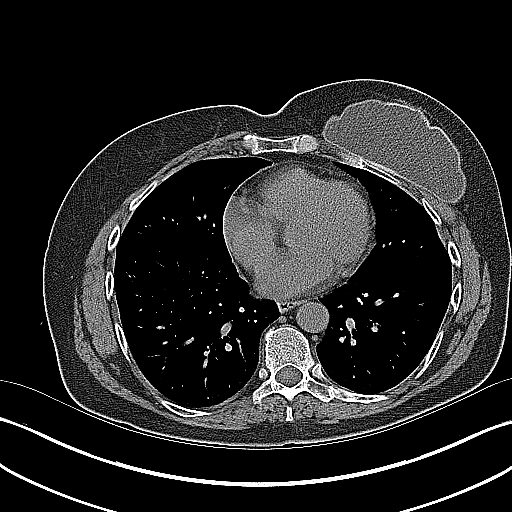
[im 53/57  soft-tissue]
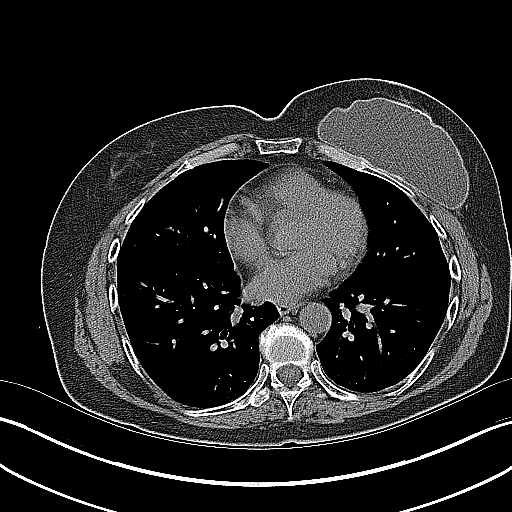

[Series 5: coronal · coronal · 0.74mm/px · 3 of 127 slices shown]
[im 43/127  soft-tissue]
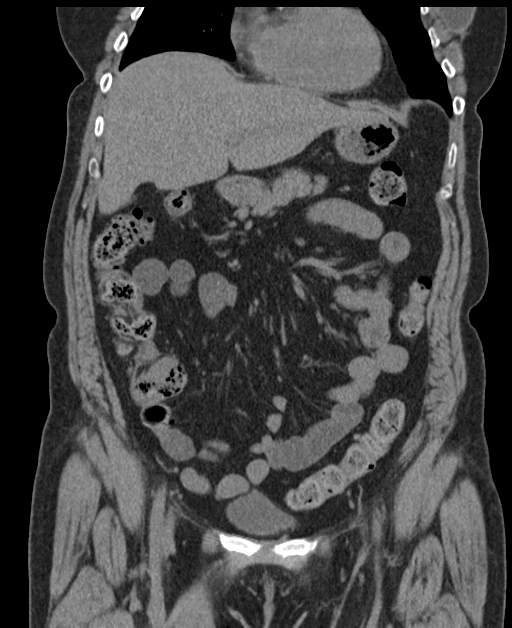
[im 57/127  soft-tissue]
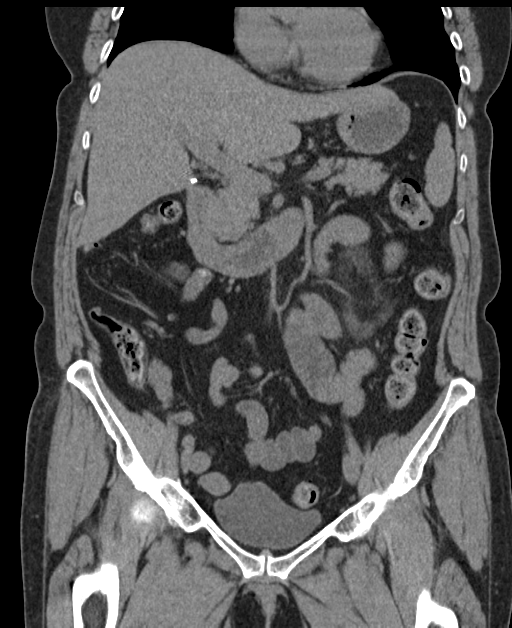
[im 71/127  soft-tissue]
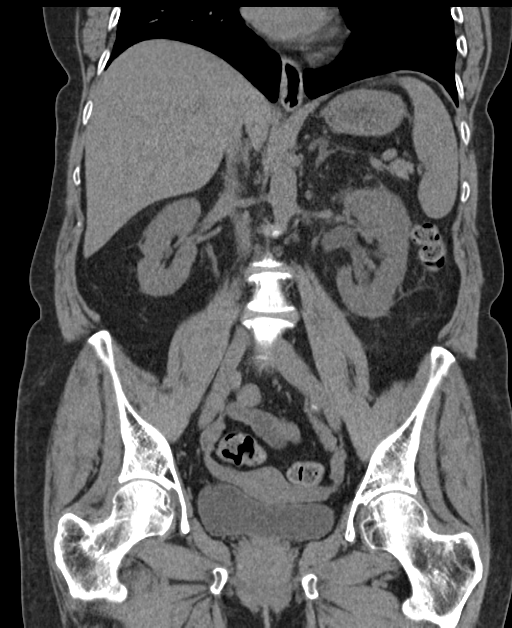

[16 of 46 positions shown; findings below may reference images not displayed]

FINDINGS: Lower chest: No pulmonary nodules or pleural effusion. No visible
pericardial effusion. There is a left breast implant.

Hepatobiliary: Normal noncontrast appearance of the liver. No
visible biliary dilatation. Normal gallbladder.

Pancreas: Normal noncontrast appearance of the pancreas. No
peripancreatic fluid collection.

Spleen: Normal.

Adrenals/Urinary Tract:

--Adrenal glands: Normal.

--Right kidney/ureter: No hydronephrosis or perinephric stranding.
No nephrolithiasis. No obstructing ureteral stones.

--Left kidney/ureter: There is mild left hydronephrosis and moderate
perinephric stranding. The proximal ureter is dilated. There is a 5
x 3 x 6 mm obstructing stone within the proximal left ureter. No
other nephrolithiasis.

--Urinary bladder: Unremarkable.

Stomach/Bowel: There is no hiatal hernia. The stomach and duodenum
are normal. There is no dilated small bowel or enteric inflammation.
There is no colonic abnormality. The appendix is normal.

Vascular/Lymphatic: There is atherosclerotic calcification of the
non aneurysmal abdominal aorta. No lymphadenopathy.

Reproductive: Normal uterus.  No adnexal mass.

Musculoskeletal. Multilevel lumbar vertebral body height loss with
Schmorl's nodes. No acute abnormality. No bony spinal canal
stenosis.
IMPRESSION: 1. Left-sided obstructive uropathy with 5 x 3 x 6 mm stone in the
proximal left ureter causing mild left hydronephrosis with moderate
perinephric stranding.
2. No other nephrolithiasis.
3.  Aortic Atherosclerosis ([id number]).

## 2018-04-02 DIAGNOSIS — M180 Bilateral primary osteoarthritis of first carpometacarpal joints: Secondary | ICD-10-CM | POA: Diagnosis not present

## 2018-05-15 IMAGING — CR DG ABDOMEN 1V
2 series · 2 of 2 positions shown · non-contrast
Comparison: 09/30/2016

CLINICAL DATA: Left ureteral stone

EXAM:
ABDOMEN - 1 VIEW

[t abdomen supine (1 of 2)]
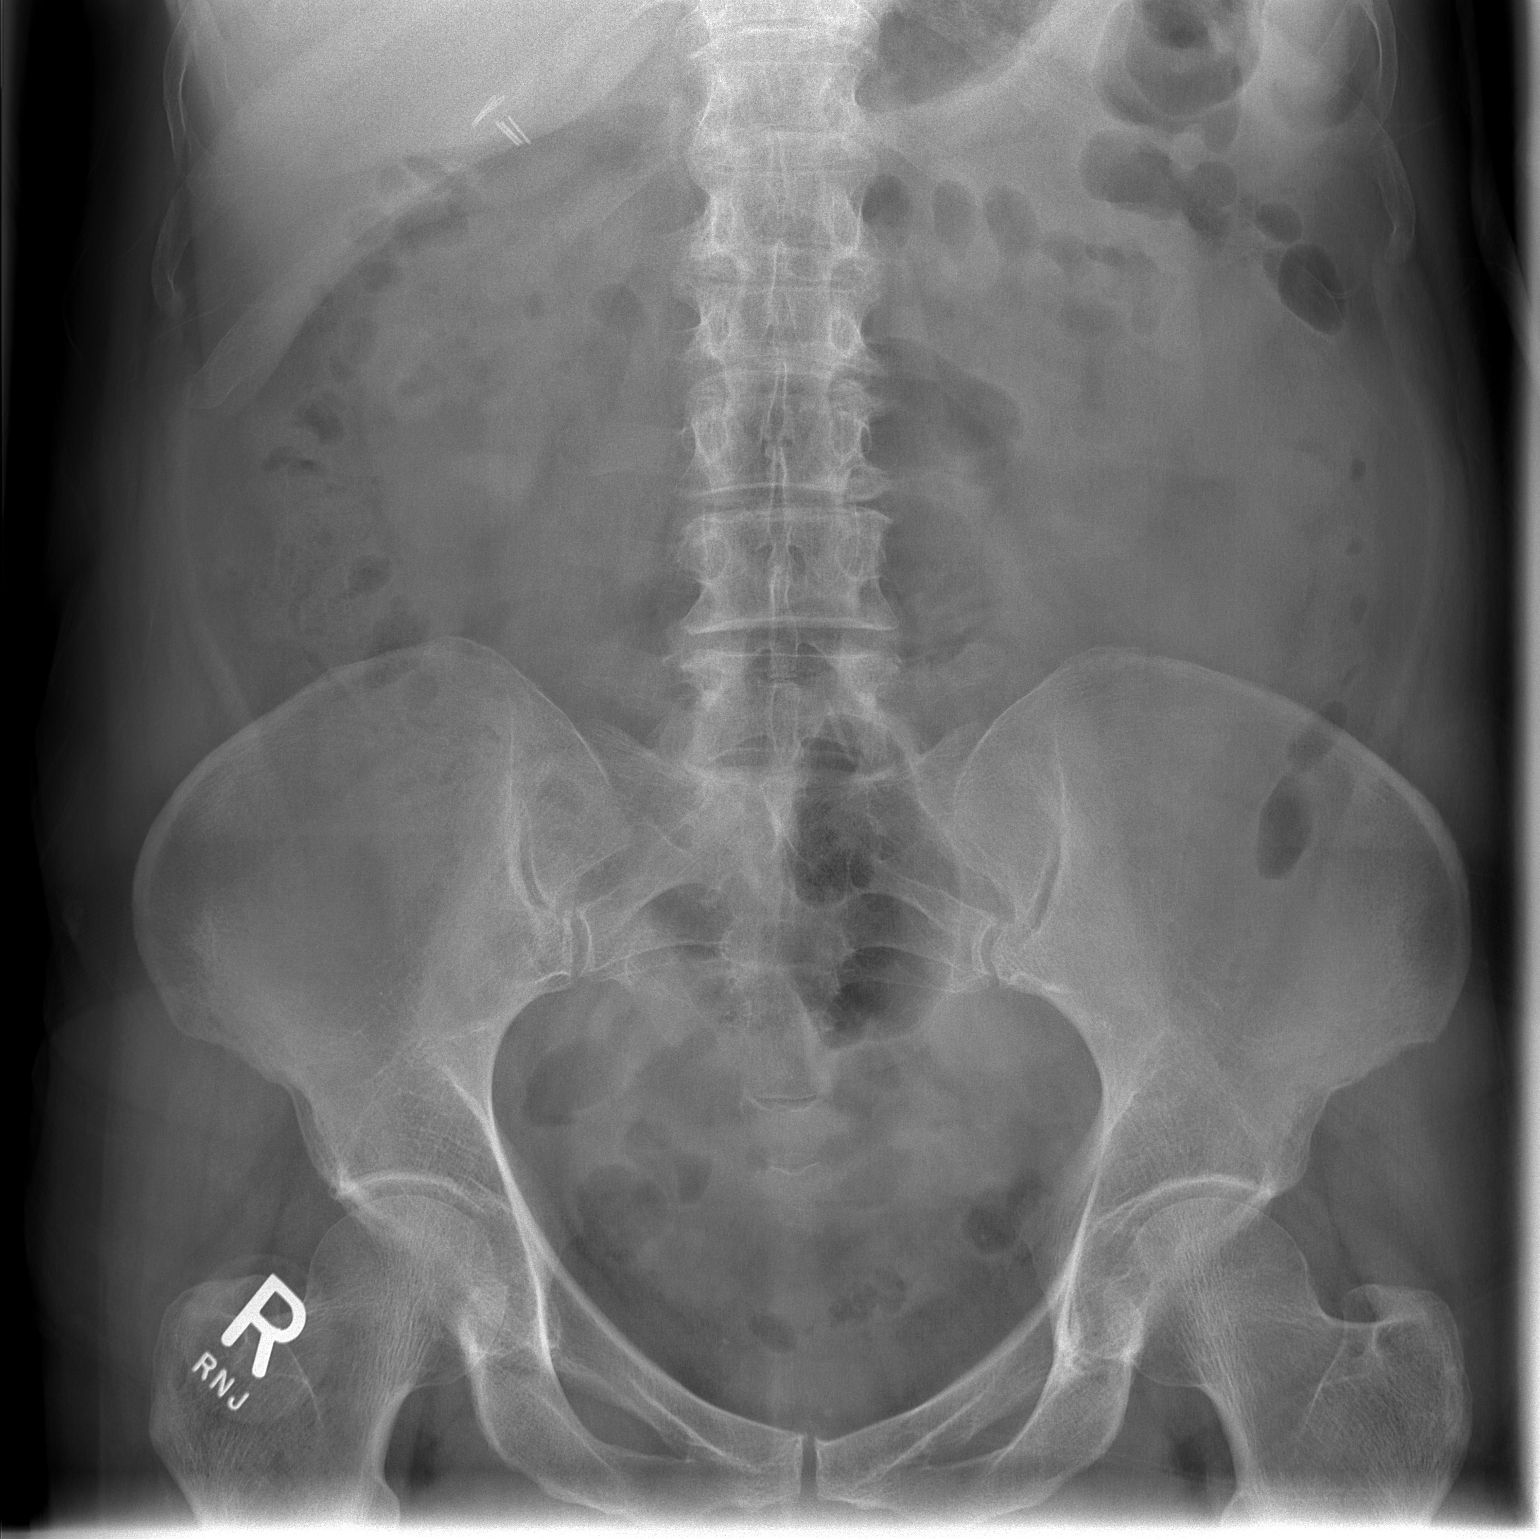

[t abdomen supine (2 of 2)]
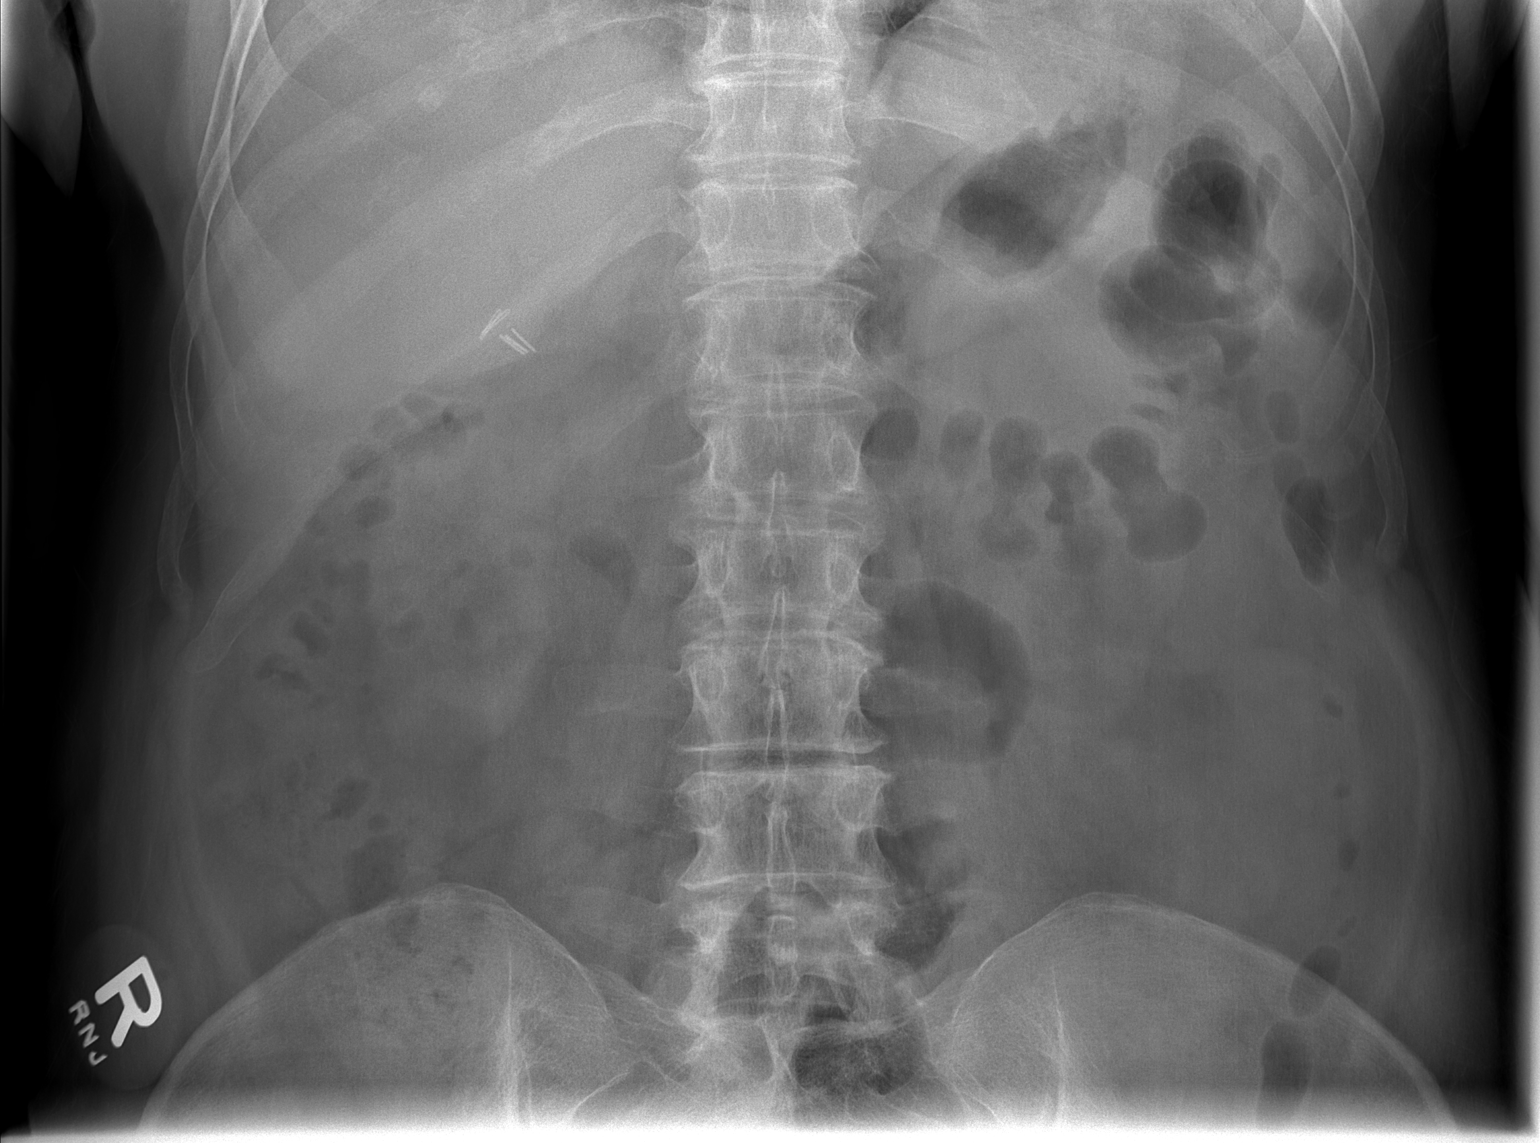

[2 of 2 positions shown; findings below may reference images not displayed]

FINDINGS: Scattered large and small bowel gas is noted. No definitive renal
stones are seen. The previously noted left mid ureteral stone is
again identified overlying the left transverse process at L4. This
is roughly stable from the prior exam. No new focal abnormality is
seen.
IMPRESSION: Stable left ureteral stone.

## 2018-07-09 DIAGNOSIS — Z0000 Encounter for general adult medical examination without abnormal findings: Secondary | ICD-10-CM | POA: Diagnosis not present

## 2018-07-09 DIAGNOSIS — M545 Low back pain: Secondary | ICD-10-CM | POA: Diagnosis not present

## 2018-07-09 DIAGNOSIS — E559 Vitamin D deficiency, unspecified: Secondary | ICD-10-CM | POA: Diagnosis not present

## 2018-07-09 DIAGNOSIS — F418 Other specified anxiety disorders: Secondary | ICD-10-CM | POA: Diagnosis not present

## 2018-07-09 DIAGNOSIS — E785 Hyperlipidemia, unspecified: Secondary | ICD-10-CM | POA: Diagnosis not present

## 2018-07-09 DIAGNOSIS — M170 Bilateral primary osteoarthritis of knee: Secondary | ICD-10-CM | POA: Diagnosis not present

## 2018-07-14 IMAGING — DX DG ABDOMEN 1V
2 series · 2 of 2 positions shown · non-contrast
Comparison: CT of the abdomen and pelvis 12/02/2016

CLINICAL DATA: preop left sided kidney stone

EXAM:
ABDOMEN - 1 VIEW

[abdomen kub (1 of 2)]
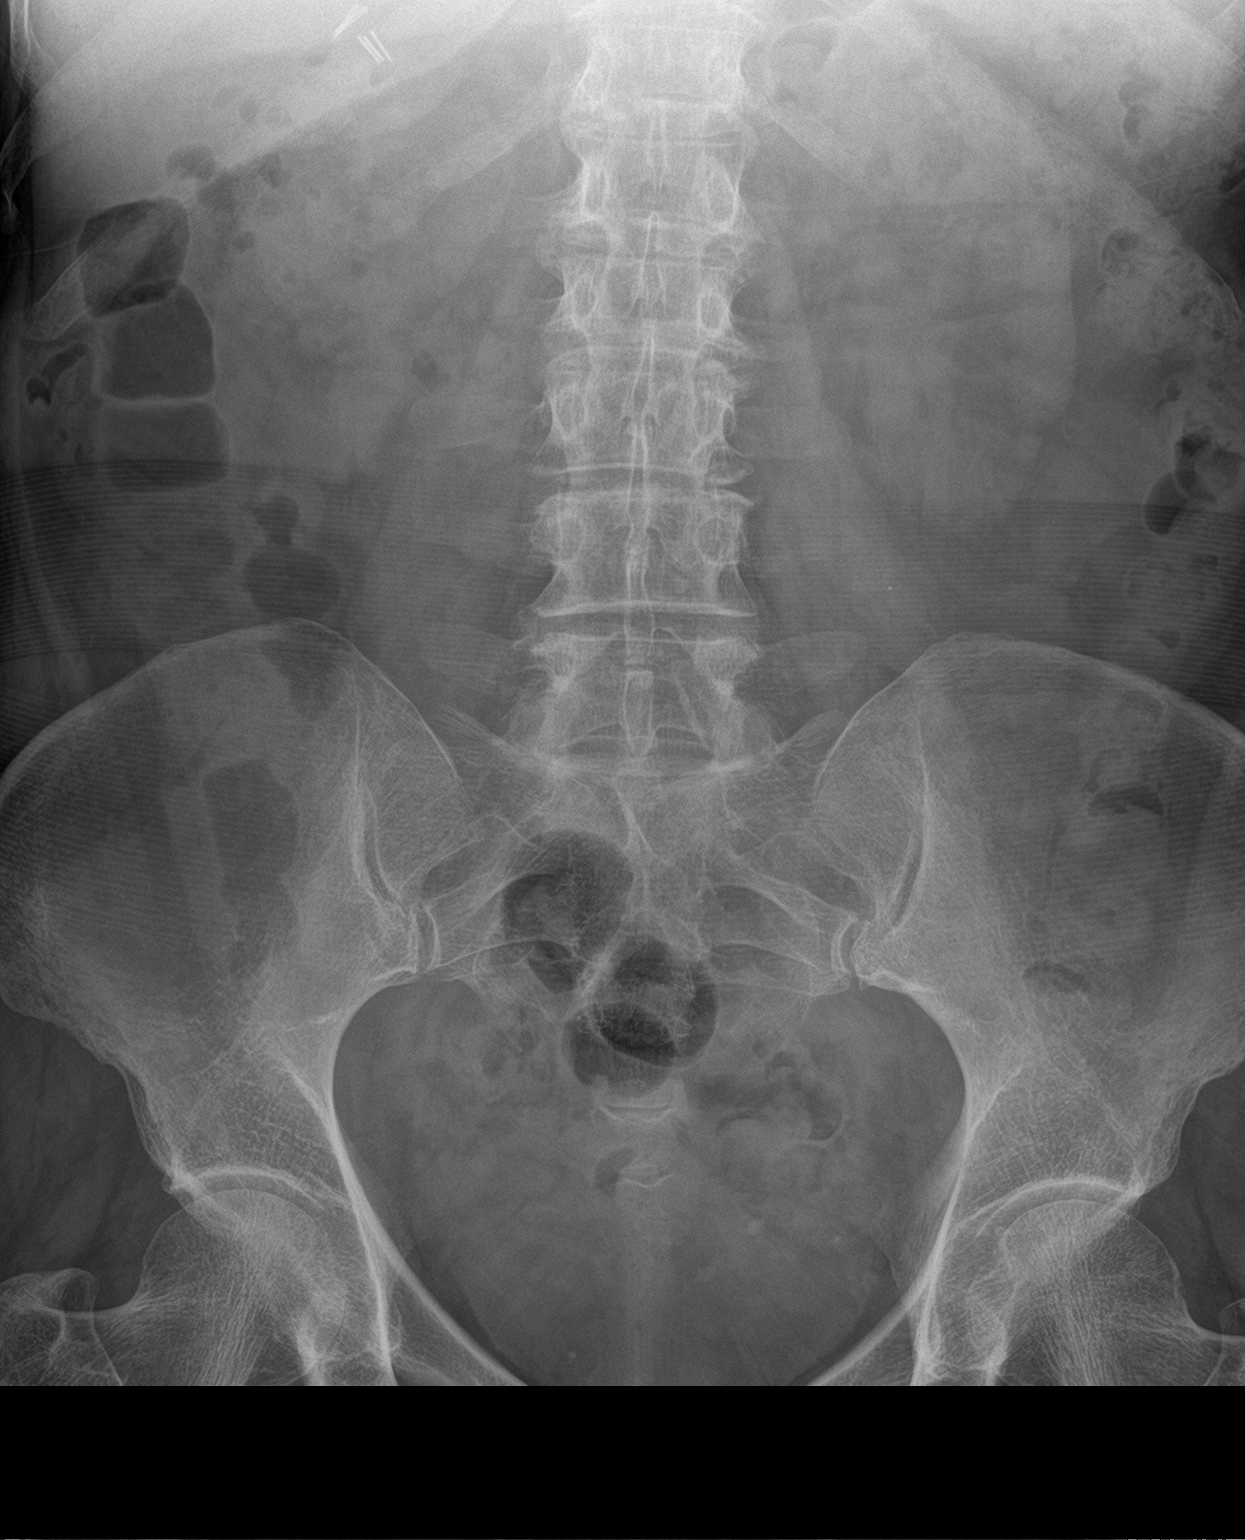

[abdomen kub (2 of 2)]
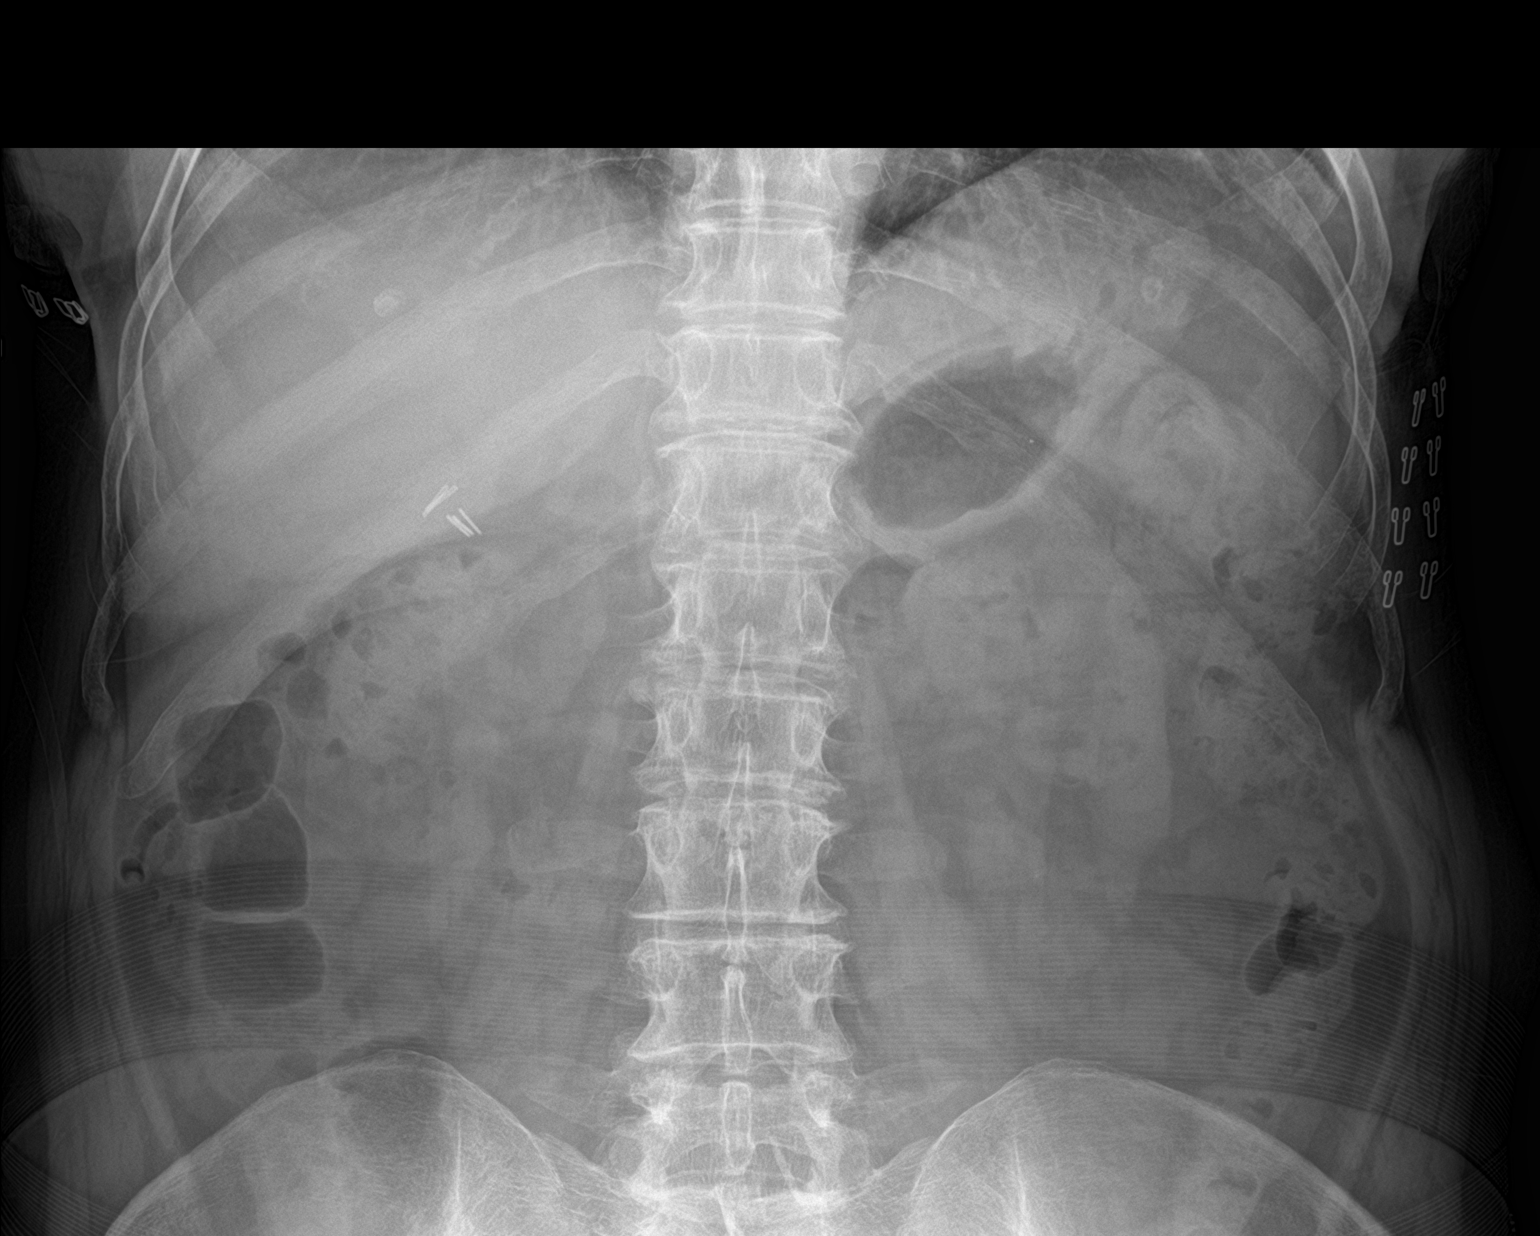

[2 of 2 positions shown; findings below may reference images not displayed]

FINDINGS: Bowel gas pattern is nonobstructive. Within the left hemipelvis
there is a 4-5 mm calcification, correlating well with the known
distal left ureteral stone. Additional calcifications in the pelvis
are likely phleboliths. Surgical clips are present in the right
upper quadrant of the abdomen.
IMPRESSION: Plain film evidence for persistent distal left ureteral stone.

## 2018-08-31 DIAGNOSIS — S83241A Other tear of medial meniscus, current injury, right knee, initial encounter: Secondary | ICD-10-CM | POA: Diagnosis not present

## 2018-08-31 DIAGNOSIS — S83242A Other tear of medial meniscus, current injury, left knee, initial encounter: Secondary | ICD-10-CM | POA: Diagnosis not present

## 2018-09-03 DIAGNOSIS — M25562 Pain in left knee: Secondary | ICD-10-CM | POA: Diagnosis not present

## 2018-10-19 DIAGNOSIS — L550 Sunburn of first degree: Secondary | ICD-10-CM | POA: Diagnosis not present

## 2018-10-19 DIAGNOSIS — L579 Skin changes due to chronic exposure to nonionizing radiation, unspecified: Secondary | ICD-10-CM | POA: Diagnosis not present

## 2018-10-19 DIAGNOSIS — L814 Other melanin hyperpigmentation: Secondary | ICD-10-CM | POA: Diagnosis not present

## 2018-10-19 DIAGNOSIS — B009 Herpesviral infection, unspecified: Secondary | ICD-10-CM | POA: Diagnosis not present

## 2018-10-19 DIAGNOSIS — D0439 Carcinoma in situ of skin of other parts of face: Secondary | ICD-10-CM | POA: Diagnosis not present

## 2018-10-19 DIAGNOSIS — L820 Inflamed seborrheic keratosis: Secondary | ICD-10-CM | POA: Diagnosis not present

## 2018-10-19 DIAGNOSIS — L570 Actinic keratosis: Secondary | ICD-10-CM | POA: Diagnosis not present

## 2018-10-28 DIAGNOSIS — Z6824 Body mass index (BMI) 24.0-24.9, adult: Secondary | ICD-10-CM | POA: Diagnosis not present

## 2018-10-28 DIAGNOSIS — Z01419 Encounter for gynecological examination (general) (routine) without abnormal findings: Secondary | ICD-10-CM | POA: Diagnosis not present

## 2018-10-28 DIAGNOSIS — Z1231 Encounter for screening mammogram for malignant neoplasm of breast: Secondary | ICD-10-CM | POA: Diagnosis not present

## 2018-10-29 DIAGNOSIS — Z20828 Contact with and (suspected) exposure to other viral communicable diseases: Secondary | ICD-10-CM | POA: Diagnosis not present

## 2018-12-31 DIAGNOSIS — L570 Actinic keratosis: Secondary | ICD-10-CM | POA: Diagnosis not present

## 2018-12-31 DIAGNOSIS — C44321 Squamous cell carcinoma of skin of nose: Secondary | ICD-10-CM | POA: Diagnosis not present

## 2018-12-31 DIAGNOSIS — L235 Allergic contact dermatitis due to other chemical products: Secondary | ICD-10-CM | POA: Diagnosis not present

## 2018-12-31 DIAGNOSIS — D225 Melanocytic nevi of trunk: Secondary | ICD-10-CM | POA: Diagnosis not present

## 2019-02-25 DIAGNOSIS — Z20828 Contact with and (suspected) exposure to other viral communicable diseases: Secondary | ICD-10-CM | POA: Diagnosis not present

## 2019-07-15 DIAGNOSIS — Z0000 Encounter for general adult medical examination without abnormal findings: Secondary | ICD-10-CM | POA: Diagnosis not present

## 2019-07-15 DIAGNOSIS — E785 Hyperlipidemia, unspecified: Secondary | ICD-10-CM | POA: Diagnosis not present

## 2019-07-15 DIAGNOSIS — G4700 Insomnia, unspecified: Secondary | ICD-10-CM | POA: Diagnosis not present

## 2019-07-15 DIAGNOSIS — M545 Low back pain: Secondary | ICD-10-CM | POA: Diagnosis not present

## 2019-07-15 DIAGNOSIS — Z23 Encounter for immunization: Secondary | ICD-10-CM | POA: Diagnosis not present

## 2019-07-15 DIAGNOSIS — F418 Other specified anxiety disorders: Secondary | ICD-10-CM | POA: Diagnosis not present

## 2019-07-15 DIAGNOSIS — E559 Vitamin D deficiency, unspecified: Secondary | ICD-10-CM | POA: Diagnosis not present

## 2019-07-15 DIAGNOSIS — Z79899 Other long term (current) drug therapy: Secondary | ICD-10-CM | POA: Diagnosis not present

## 2019-07-15 DIAGNOSIS — M170 Bilateral primary osteoarthritis of knee: Secondary | ICD-10-CM | POA: Diagnosis not present

## 2019-08-23 DIAGNOSIS — M1812 Unilateral primary osteoarthritis of first carpometacarpal joint, left hand: Secondary | ICD-10-CM | POA: Diagnosis not present

## 2019-08-23 DIAGNOSIS — M180 Bilateral primary osteoarthritis of first carpometacarpal joints: Secondary | ICD-10-CM | POA: Diagnosis not present

## 2019-09-22 DIAGNOSIS — Z20828 Contact with and (suspected) exposure to other viral communicable diseases: Secondary | ICD-10-CM | POA: Diagnosis not present

## 2019-09-23 ENCOUNTER — Other Ambulatory Visit: Payer: Self-pay | Admitting: Internal Medicine

## 2019-09-23 ENCOUNTER — Ambulatory Visit
Admission: RE | Admit: 2019-09-23 | Discharge: 2019-09-23 | Disposition: A | Discharge status: Home / Self Care | Payer: BLUE CROSS/BLUE SHIELD | Source: Ambulatory Visit | Attending: Internal Medicine | Admitting: Internal Medicine

## 2019-09-23 DIAGNOSIS — M545 Low back pain, unspecified: Secondary | ICD-10-CM

## 2019-09-23 DIAGNOSIS — M47816 Spondylosis without myelopathy or radiculopathy, lumbar region: Secondary | ICD-10-CM | POA: Diagnosis not present

## 2019-09-23 DIAGNOSIS — I700 Atherosclerosis of aorta: Secondary | ICD-10-CM | POA: Diagnosis not present

## 2019-09-23 DIAGNOSIS — M2578 Osteophyte, vertebrae: Secondary | ICD-10-CM | POA: Diagnosis not present

## 2019-09-23 DIAGNOSIS — F418 Other specified anxiety disorders: Secondary | ICD-10-CM | POA: Diagnosis not present

## 2019-10-06 DIAGNOSIS — E785 Hyperlipidemia, unspecified: Secondary | ICD-10-CM | POA: Diagnosis not present

## 2019-10-08 ENCOUNTER — Other Ambulatory Visit: Payer: Self-pay | Admitting: Internal Medicine

## 2019-10-08 DIAGNOSIS — E785 Hyperlipidemia, unspecified: Secondary | ICD-10-CM

## 2019-10-21 ENCOUNTER — Ambulatory Visit
Admission: RE | Admit: 2019-10-21 | Discharge: 2019-10-21 | Disposition: A | Discharge status: Home / Self Care | Payer: No Typology Code available for payment source | Source: Ambulatory Visit | Attending: Internal Medicine | Admitting: Internal Medicine

## 2019-10-21 DIAGNOSIS — E785 Hyperlipidemia, unspecified: Secondary | ICD-10-CM

## 2019-11-02 DIAGNOSIS — L570 Actinic keratosis: Secondary | ICD-10-CM | POA: Diagnosis not present

## 2019-11-02 DIAGNOSIS — Z85828 Personal history of other malignant neoplasm of skin: Secondary | ICD-10-CM | POA: Diagnosis not present

## 2019-11-02 DIAGNOSIS — L700 Acne vulgaris: Secondary | ICD-10-CM | POA: Diagnosis not present

## 2019-12-22 DIAGNOSIS — Z20822 Contact with and (suspected) exposure to covid-19: Secondary | ICD-10-CM | POA: Diagnosis not present

## 2019-12-29 DIAGNOSIS — F418 Other specified anxiety disorders: Secondary | ICD-10-CM | POA: Diagnosis not present

## 2019-12-29 DIAGNOSIS — Z79899 Other long term (current) drug therapy: Secondary | ICD-10-CM | POA: Diagnosis not present

## 2019-12-29 DIAGNOSIS — E785 Hyperlipidemia, unspecified: Secondary | ICD-10-CM | POA: Diagnosis not present

## 2019-12-29 DIAGNOSIS — M545 Low back pain, unspecified: Secondary | ICD-10-CM | POA: Diagnosis not present

## 2020-01-21 DIAGNOSIS — Z20822 Contact with and (suspected) exposure to covid-19: Secondary | ICD-10-CM | POA: Diagnosis not present

## 2020-01-31 ENCOUNTER — Other Ambulatory Visit: Payer: BC Managed Care – PPO

## 2020-01-31 DIAGNOSIS — Z20822 Contact with and (suspected) exposure to covid-19: Secondary | ICD-10-CM

## 2020-02-01 DIAGNOSIS — Z20822 Contact with and (suspected) exposure to covid-19: Secondary | ICD-10-CM | POA: Diagnosis not present

## 2020-02-03 LAB — NOVEL CORONAVIRUS, NAA: SARS-CoV-2, NAA: NOT DETECTED

## 2020-02-03 LAB — SARS-COV-2, NAA 2 DAY TAT

## 2020-02-08 DIAGNOSIS — Z1231 Encounter for screening mammogram for malignant neoplasm of breast: Secondary | ICD-10-CM | POA: Diagnosis not present

## 2020-02-08 DIAGNOSIS — Z01419 Encounter for gynecological examination (general) (routine) without abnormal findings: Secondary | ICD-10-CM | POA: Diagnosis not present

## 2020-02-08 DIAGNOSIS — Z6825 Body mass index (BMI) 25.0-25.9, adult: Secondary | ICD-10-CM | POA: Diagnosis not present

## 2021-10-10 ENCOUNTER — Ambulatory Visit (INDEPENDENT_AMBULATORY_CARE_PROVIDER_SITE_OTHER): Payer: Medicare Other | Admitting: Podiatry

## 2021-10-10 ENCOUNTER — Ambulatory Visit (INDEPENDENT_AMBULATORY_CARE_PROVIDER_SITE_OTHER): Payer: Medicare Other

## 2021-10-10 DIAGNOSIS — D3610 Benign neoplasm of peripheral nerves and autonomic nervous system, unspecified: Secondary | ICD-10-CM | POA: Diagnosis not present

## 2021-10-10 DIAGNOSIS — M2042 Other hammer toe(s) (acquired), left foot: Secondary | ICD-10-CM | POA: Diagnosis not present

## 2021-10-10 DIAGNOSIS — L600 Ingrowing nail: Secondary | ICD-10-CM | POA: Diagnosis not present

## 2021-10-10 DIAGNOSIS — M779 Enthesopathy, unspecified: Secondary | ICD-10-CM

## 2021-10-10 NOTE — Patient Instructions (Signed)

## 2021-10-10 NOTE — Progress Notes (Signed)
Subjective:   Patient ID: Carrie Holland, female   DOB: 66 y.o.   MRN: [id number]   HPI Patient presents stating she traumatized her left big toenail and its been thickened and growing abnormally and she has chronic neuroma and digital deformity left that she wants to get some done about stating that its been hurting her for the years since we saw her previously.  Patient does not smoke likes to be active   Review of Systems  All other systems reviewed and are negative.       Objective:  Physical Exam Vitals and nursing note reviewed.  Constitutional:      Appearance: She is well-developed.  Pulmonary:     Effort: Pulmonary effort is normal.  Musculoskeletal:        General: Normal range of motion.  Skin:    General: Skin is warm.  Neurological:     Mental Status: She is alert.     Neurovascular status intact muscle strength adequate range of motion adequate with patient noted to have pain in the third interspace left with prominent neuroma-like symptoms and positive Mulder sign and distal deformity digit 4 left with pain along with a damaged left hallux nail that is incurvated sore when pressed both medial lateral and on the dorsal surface with a humpback appearance.  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  Patient wants to undergo procedure understands risk and at this point after extensive review signed consent form with all questions answered understand recovery can take 3 to 6 months.  As far as nail  goes I did infiltrate 60 mg like Marcaine mixture sterile prep done using sterile instrumentation remove the hallux nail and flushed the bed applied sterile dressing reappoint to recheck.  X-rays indicate distal deformity digit 4 left no other signs of bony pathology

## 2021-10-10 NOTE — Progress Notes (Signed)
fc 

## 2021-10-29 MED ORDER — HYDROCODONE-ACETAMINOPHEN 10-325 MG PO TABS: 1.0000 | ORAL_TABLET | Freq: Three times a day (TID) | ORAL | 0 refills | Status: AC | PRN

## 2021-10-29 NOTE — Addendum Note (Signed)
Addended by: Wallene Huh on: 10/29/2021 05:07 PM   Modules accepted: Orders

## 2021-10-30 DIAGNOSIS — G5762 Lesion of plantar nerve, left lower limb: Secondary | ICD-10-CM | POA: Diagnosis not present

## 2021-10-30 DIAGNOSIS — M2042 Other hammer toe(s) (acquired), left foot: Secondary | ICD-10-CM | POA: Diagnosis not present

## 2021-11-05 ENCOUNTER — Encounter: Payer: Self-pay | Admitting: Podiatry

## 2021-11-05 ENCOUNTER — Ambulatory Visit (INDEPENDENT_AMBULATORY_CARE_PROVIDER_SITE_OTHER): Payer: Medicare Other

## 2021-11-05 ENCOUNTER — Ambulatory Visit (INDEPENDENT_AMBULATORY_CARE_PROVIDER_SITE_OTHER): Payer: Medicare Other | Admitting: Podiatry

## 2021-11-05 DIAGNOSIS — M7752 Other enthesopathy of left foot: Secondary | ICD-10-CM | POA: Diagnosis not present

## 2021-11-05 DIAGNOSIS — Z9889 Other specified postprocedural states: Secondary | ICD-10-CM | POA: Diagnosis not present

## 2021-11-05 NOTE — Progress Notes (Signed)
Subjective:   Patient ID: Job Founds, female   DOB: 66 y.o.   MRN: [id number]   HPI Patient presents stating doing very well very pleased so far with surgery   ROS      Objective:  Physical Exam  Neurovascular status intact negative Bevelyn Buckles' sign noted third interspace left fourth digit left healing well stitches intact fourth toe good alignment noted     Assessment:  Well post neurectomy arthroplasty fourth left     Plan:  Reviewed x-rays sterile dressing reapplied reappoint around 2 weeks for suture removal and at that time can be dispensed ankle compression stocking and can gradually increase activity with slow return to shoe gear.  Will not need to see me after that visit.  Patient is encouraged to call questions concerns which may arise  X-rays indicate satisfactory resection of bone digit 4 left good alignment noted

## 2021-11-19 ENCOUNTER — Ambulatory Visit (INDEPENDENT_AMBULATORY_CARE_PROVIDER_SITE_OTHER): Payer: Medicare Other

## 2021-11-19 DIAGNOSIS — Z9889 Other specified postprocedural states: Secondary | ICD-10-CM

## 2021-11-19 NOTE — Progress Notes (Signed)
Patient in office POV #2 DOS 10/30/2021 HAMMER TOE REPAIR 4TH AND NEURECTOMY LEFT.  Patient in office to have sutures removed form the fourth digit of the left foot. Sutures were removed without complication. Patient tolerated suture removal well. Ankle compression stocking was dispensed at this visit. Advised patient to gradually increase activity and slowly return to normal shoe gear.   Patient verbalized understanding. No return appointment needed.

## 2021-12-10 ENCOUNTER — Encounter: Payer: Self-pay | Admitting: Podiatry

## 2022-04-12 ENCOUNTER — Other Ambulatory Visit: Payer: Self-pay | Admitting: Obstetrics and Gynecology

## 2022-04-12 DIAGNOSIS — R928 Other abnormal and inconclusive findings on diagnostic imaging of breast: Secondary | ICD-10-CM

## 2022-04-16 ENCOUNTER — Ambulatory Visit
Admission: RE | Admit: 2022-04-16 | Discharge: 2022-04-16 | Disposition: A | Discharge status: Home / Self Care | Payer: Medicare Other | Source: Ambulatory Visit | Attending: Obstetrics and Gynecology | Admitting: Obstetrics and Gynecology

## 2022-04-16 ENCOUNTER — Other Ambulatory Visit: Payer: Self-pay | Admitting: Obstetrics and Gynecology

## 2022-04-16 DIAGNOSIS — R928 Other abnormal and inconclusive findings on diagnostic imaging of breast: Secondary | ICD-10-CM

## 2022-04-29 ENCOUNTER — Ambulatory Visit
Admission: RE | Admit: 2022-04-29 | Discharge: 2022-04-29 | Disposition: A | Discharge status: Home / Self Care | Payer: Medicare Other | Source: Ambulatory Visit | Attending: Obstetrics and Gynecology | Admitting: Obstetrics and Gynecology

## 2022-04-29 DIAGNOSIS — R928 Other abnormal and inconclusive findings on diagnostic imaging of breast: Secondary | ICD-10-CM

## 2022-04-29 HISTORY — PX: BREAST BIOPSY: SHX20

## 2023-07-07 ENCOUNTER — Ambulatory Visit (INDEPENDENT_AMBULATORY_CARE_PROVIDER_SITE_OTHER): Admitting: Podiatry

## 2023-07-07 ENCOUNTER — Ambulatory Visit

## 2023-07-07 ENCOUNTER — Encounter: Payer: Self-pay | Admitting: Podiatry

## 2023-07-07 DIAGNOSIS — M7752 Other enthesopathy of left foot: Secondary | ICD-10-CM

## 2023-07-07 DIAGNOSIS — L600 Ingrowing nail: Secondary | ICD-10-CM

## 2023-07-07 NOTE — Patient Instructions (Signed)

## 2023-07-09 NOTE — Progress Notes (Signed)
 Subjective:   Patient ID: Carrie Holland, female   DOB: 68 y.o.   MRN: [id number]   HPI Patient states her left big toenail has grown back thicker and it is bothersome for her especially in the lateral corner.  States she tries to trim it but that is not effective   ROS      Objective:  Physical Exam  Neurovascular status intact good digital perfusion noted thickened left hallux nail with the lateral border seeming to be the epicenter of the discomfort she is experiencing     Assessment:  Ingrown toenail deformity left hallux lateral border with pain     Plan:  H&P reviewed recommended correction of the corner but she understands someday she may lose the entire nail.  At this point I allowed her to read then signed consent form and I infiltrated the left big toe 60 mg Xylocaine  Marcaine  mixture sterile prep done using sterile instrumentation remove the lateral border exposed the matrix applied phenol 3 applications 30 seconds followed by alcohol lavage sterile dressing gave instructions on soaks wear dressing for 24 hours take it off earlier if throbbing should occur
